# Patient Record
Sex: Female | Born: 1975 | Race: White | Hispanic: No | Marital: Single | Smoking: Former smoker
Health system: Southern US, Community
[De-identification: ages and names within clinical notes are randomized; demographics above are authoritative.]

## PROBLEM LIST (undated history)

## (undated) DIAGNOSIS — E119 Type 2 diabetes mellitus without complications: Secondary | ICD-10-CM

## (undated) DIAGNOSIS — B019 Varicella without complication: Secondary | ICD-10-CM

## (undated) HISTORY — PX: GALLBLADDER SURGERY: SHX652

## (undated) HISTORY — DX: Type 2 diabetes mellitus without complications: E11.9

## (undated) HISTORY — DX: Varicella without complication: B01.9

---

## 2003-03-08 HISTORY — PX: KNEE ARTHROSCOPY: SHX127

## 2005-07-18 ENCOUNTER — Ambulatory Visit: Payer: Self-pay | Admitting: Specialist

## 2005-08-05 ENCOUNTER — Ambulatory Visit: Payer: Self-pay | Admitting: Specialist

## 2015-08-05 ENCOUNTER — Other Ambulatory Visit: Payer: Self-pay | Admitting: Obstetrics and Gynecology

## 2015-08-05 DIAGNOSIS — Z1231 Encounter for screening mammogram for malignant neoplasm of breast: Secondary | ICD-10-CM

## 2015-08-14 ENCOUNTER — Other Ambulatory Visit: Payer: Self-pay | Admitting: Obstetrics and Gynecology

## 2015-08-14 ENCOUNTER — Ambulatory Visit
Admission: RE | Admit: 2015-08-14 | Discharge: 2015-08-14 | Disposition: A | Discharge status: Home / Self Care | Payer: BC Managed Care – PPO | Source: Ambulatory Visit | Attending: Obstetrics and Gynecology | Admitting: Obstetrics and Gynecology

## 2015-08-14 DIAGNOSIS — Z1231 Encounter for screening mammogram for malignant neoplasm of breast: Secondary | ICD-10-CM

## 2017-06-08 ENCOUNTER — Encounter: Payer: Self-pay | Admitting: Emergency Medicine

## 2017-06-08 ENCOUNTER — Other Ambulatory Visit: Payer: Self-pay

## 2017-06-08 ENCOUNTER — Emergency Department
Admission: EM | Admit: 2017-06-08 | Discharge: 2017-06-08 | Disposition: A | Discharge status: Home / Self Care | Payer: BC Managed Care – PPO | Attending: Emergency Medicine | Admitting: Emergency Medicine

## 2017-06-08 ENCOUNTER — Emergency Department: Payer: BC Managed Care – PPO

## 2017-06-08 DIAGNOSIS — S82839A Other fracture of upper and lower end of unspecified fibula, initial encounter for closed fracture: Secondary | ICD-10-CM

## 2017-06-08 DIAGNOSIS — X501XXA Overexertion from prolonged static or awkward postures, initial encounter: Secondary | ICD-10-CM | POA: Insufficient documentation

## 2017-06-08 DIAGNOSIS — F17210 Nicotine dependence, cigarettes, uncomplicated: Secondary | ICD-10-CM | POA: Diagnosis not present

## 2017-06-08 DIAGNOSIS — Y939 Activity, unspecified: Secondary | ICD-10-CM | POA: Diagnosis not present

## 2017-06-08 DIAGNOSIS — Y929 Unspecified place or not applicable: Secondary | ICD-10-CM | POA: Diagnosis not present

## 2017-06-08 DIAGNOSIS — S82892A Other fracture of left lower leg, initial encounter for closed fracture: Secondary | ICD-10-CM | POA: Diagnosis not present

## 2017-06-08 DIAGNOSIS — Y999 Unspecified external cause status: Secondary | ICD-10-CM | POA: Diagnosis not present

## 2017-06-08 MED ORDER — MELOXICAM 15 MG PO TABS: 15.0000 mg | ORAL_TABLET | Freq: Every day | ORAL | 0 refills | Status: DC

## 2017-06-08 MED ORDER — HYDROCODONE-ACETAMINOPHEN 5-325 MG PO TABS: 1.0000 | ORAL_TABLET | ORAL | 0 refills | Status: AC | PRN

## 2017-06-08 MED ORDER — HYDROCODONE-ACETAMINOPHEN 5-325 MG PO TABS
1.0000 | ORAL_TABLET | Freq: Once | ORAL | Status: AC
  Administered 2017-06-08: 1 via ORAL
  Filled 2017-06-08: qty 1

## 2017-06-08 NOTE — ED Provider Notes (Signed)
[facility] Emergency Department Provider Note ____________________________________________  Time seen: Approximately 6:51 PM  I have reviewed the triage vital signs and the nursing notes.   HISTORY  Chief Complaint Ankle Pain    HPI Colleen Wagner is a 42 y.o. female who presents to the emergency department for evaluation and treatment of left ankle pain after a twist injury prior to arrival. She did not fall. She denies other injury or pain. No alleviating measures have been attempted for this complaint.  History reviewed. No pertinent past medical history.  There are no active problems to display for this patient.   History reviewed. No pertinent surgical history.  Prior to Admission medications   Medication Sig Start Date End Date Taking? Authorizing Provider  HYDROcodone-acetaminophen (NORCO/VICODIN) 5-325 MG tablet Take 1 tablet by mouth every 4 (four) hours as needed for moderate pain. 06/08/17 06/08/18  Jose Corvin, Rulon Eisenmengerari B, FNP  meloxicam (MOBIC) 15 MG tablet Take 1 tablet (15 mg total) by mouth daily. 06/08/17   Chinita Pesterriplett, Cleven Jansma B, FNP    Allergies Patient has no known allergies.  History reviewed. No pertinent family history.  Social History Social History   Tobacco Use  . Smoking status: Current Every Day Smoker    Packs/day: 0.50  . Smokeless tobacco: Never Used  Substance Use Topics  . Alcohol use: Not on file    Comment: rarely  . Drug use: Never    Review of Systems Constitutional: Negative for fever. Cardiovascular: Negative for chest pain. Respiratory: Negative for shortness of breath. Musculoskeletal: Positive for left ankle pain. Skin: Negative for open wound or lesion.  Neurological: Negative for decrease in sensation  ____________________________________________   PHYSICAL EXAM:  VITAL SIGNS: ED Triage Vitals  Enc Vitals Group     BP 06/08/17 1758 (!) 142/96     Pulse Rate 06/08/17 1758 95     Resp 06/08/17 1758 18   Temp 06/08/17 1758 98.9 F (37.2 C)     Temp Source 06/08/17 1758 Oral     SpO2 06/08/17 1758 97 %     Weight 06/08/17 1758 250 lb (113.4 kg)     Height 06/08/17 1758 5\' 5"  (1.651 m)     Head Circumference --      Peak Flow --      Pain Score 06/08/17 1759 6     Pain Loc --      Pain Edu? --      Excl. in GC? --     Constitutional: Alert and oriented. Well appearing and in no acute distress. Eyes: Conjunctivae are clear without discharge or drainage Head: Atraumatic Neck: Supple Respiratory: No cough. Respirations are even and unlabored. Musculoskeletal: ATFL pattern edema of the left ankle.  Neurologic: Motor and sensory function intact.  Skin: Intact, specifically over the left foot and ankle.  Psychiatric: Affect and behavior are appropriate.  ____________________________________________   LABS (all labs ordered are listed, but only abnormal results are displayed)  Labs Reviewed - No data to display ____________________________________________  RADIOLOGY  Images of the left ankle shows a possible avulsion fracture off the lateral fibular malleolus per radiology.  I, Kem Boroughsari [place], personally viewed and evaluated these images (plain radiographs) as part of my medical decision making, as well as reviewing the written report by the radiologist.  ___________________________________________   PROCEDURES  .Splint Application Date/Time: 06/08/2017 8:24 PM Performed by: May, Lisa J, NT Authorized by: Chinita Pesterriplett, Aniqua Briere B, FNP   Consent:    Consent obtained:  Verbal  Consent given by:  Patient   Risks discussed:  Pain and swelling Pre-procedure details:    Sensation:  Normal Procedure details:    Laterality:  Left   Location:  Ankle   Ankle:  L ankle   Splint type:  Ankle stirrup   Supplies:  Cotton padding, Ortho-Glass and elastic bandage Post-procedure details:    Pain:  Unchanged   Sensation:  Normal   Patient tolerance of procedure:  Tolerated well, no  immediate complications  ___________________________________________   INITIAL IMPRESSION / ASSESSMENT AND PLAN / ED COURSE  Colleen Wagner is a 42 y.o. who presents to the emergency department for treatment and evaluation of left ankle pain after a inversion injury prior to arrival.  Image and exam are consistent with a lateral malleolar fracture.  She was placed in a OCL stirrup and encouraged to avoid weightbearing as much as possible.  She will be given crutches, but was advised to use 1 crutch under the left arm to assist with ambulation  Patient instructed to follow-up with podiatry.  She was also instructed to return to the emergency department for symptoms that change or worsen if unable schedule an appointment with orthopedics or primary care.  Medications  HYDROcodone-acetaminophen (NORCO/VICODIN) 5-325 MG per tablet 1 tablet (has no administration in time range)    Pertinent labs & imaging results that were available during my care of the patient were reviewed by me and considered in my medical decision making (see chart for details).  _________________________________________   FINAL CLINICAL IMPRESSION(S) / ED DIAGNOSES  Final diagnoses:  Avulsion fracture of distal end of fibula    ED Discharge Orders        Ordered    HYDROcodone-acetaminophen (NORCO/VICODIN) 5-325 MG tablet  Every 4 hours PRN     06/08/17 2018    meloxicam (MOBIC) 15 MG tablet  Daily     06/08/17 2018       If controlled substance prescribed during this visit, 12 month history viewed on the [facility] prior to issuing an initial prescription for Schedule II or III opiod.    Chinita Pester, FNP 06/08/17 2026    Minna Antis, MD 06/09/17 0005

## 2017-06-08 NOTE — ED Triage Notes (Signed)
Pt took a wrong step and fell; turning her ankle. She denies hitting her head or loc. Pt states she almost vomited from the pain when she tried to bear weight on the ankle. Pt alert & oriented with NAD noted.

## 2017-06-08 NOTE — Discharge Instructions (Addendum)
Please call tomorrow to schedule an appointment with podiatry. Return to the ER for symptoms of concern if unable to see your PCP or the specialist.

## 2018-02-14 ENCOUNTER — Emergency Department
Admission: EM | Admit: 2018-02-14 | Discharge: 2018-02-14 | Disposition: A | Discharge status: Home / Self Care | Payer: BC Managed Care – PPO | Attending: Emergency Medicine | Admitting: Emergency Medicine

## 2018-02-14 ENCOUNTER — Other Ambulatory Visit: Payer: Self-pay

## 2018-02-14 DIAGNOSIS — W109XXA Fall (on) (from) unspecified stairs and steps, initial encounter: Secondary | ICD-10-CM | POA: Diagnosis not present

## 2018-02-14 DIAGNOSIS — F17200 Nicotine dependence, unspecified, uncomplicated: Secondary | ICD-10-CM | POA: Diagnosis not present

## 2018-02-14 DIAGNOSIS — Y999 Unspecified external cause status: Secondary | ICD-10-CM | POA: Diagnosis not present

## 2018-02-14 DIAGNOSIS — Z79899 Other long term (current) drug therapy: Secondary | ICD-10-CM | POA: Insufficient documentation

## 2018-02-14 DIAGNOSIS — Y939 Activity, unspecified: Secondary | ICD-10-CM | POA: Insufficient documentation

## 2018-02-14 DIAGNOSIS — Y929 Unspecified place or not applicable: Secondary | ICD-10-CM | POA: Insufficient documentation

## 2018-02-14 DIAGNOSIS — S39012A Strain of muscle, fascia and tendon of lower back, initial encounter: Secondary | ICD-10-CM | POA: Diagnosis not present

## 2018-02-14 DIAGNOSIS — S3982XA Other specified injuries of lower back, initial encounter: Secondary | ICD-10-CM | POA: Diagnosis present

## 2018-02-14 MED ORDER — KETOROLAC TROMETHAMINE 30 MG/ML IJ SOLN
30.0000 mg | Freq: Once | INTRAMUSCULAR | Status: AC
  Administered 2018-02-14: 30 mg via INTRAMUSCULAR
  Filled 2018-02-14: qty 1

## 2018-02-14 MED ORDER — KETOROLAC TROMETHAMINE 10 MG PO TABS: 10.0000 mg | ORAL_TABLET | Freq: Four times a day (QID) | ORAL | 0 refills | Status: DC | PRN

## 2018-02-14 MED ORDER — CYCLOBENZAPRINE HCL 10 MG PO TABS: 10.0000 mg | ORAL_TABLET | Freq: Three times a day (TID) | ORAL | 0 refills | Status: DC | PRN

## 2018-02-14 NOTE — ED Triage Notes (Signed)
Pt in with co lower back pain states started after missing a step and states "when I caught myself I felt pain".

## 2018-02-14 NOTE — Discharge Instructions (Signed)
Follow up with the primary care provider of your choice for symptoms that are not improving over the next few days.  Return to the ER for symptoms that change or worsen if unable to schedule an appointment. 

## 2018-02-14 NOTE — ED Provider Notes (Signed)
[facility] Emergency Department Provider Note ____________________________________________  Time seen: Approximately 9:25 PM  I have reviewed the triage vital signs and the nursing notes.  HISTORY  Chief Complaint Back Pain   HPI Lyman Bishopara J Wood is a 42 y.o. female who presents to the emergency department after missing a step which caused her to slip and fall forward. She was able to catch herself before striking the ground. She felt a pull on the right side of her back. She has had a similar injury in the past that was eventually treated with toradol which provided her great relief. No other symptoms or injury of concern.  No past medical history on file.  There are no active problems to display for this patient.   No past surgical history on file.  Prior to Admission medications   Medication Sig Start Date End Date Taking? Authorizing Provider  cyclobenzaprine (FLEXERIL) 10 MG tablet Take 1 tablet (10 mg total) by mouth 3 (three) times daily as needed for muscle spasms. 02/14/18   Kelii Chittum, Rulon Eisenmengerari B, FNP  HYDROcodone-acetaminophen (NORCO/VICODIN) 5-325 MG tablet Take 1 tablet by mouth every 4 (four) hours as needed for moderate pain. 06/08/17 06/08/18  Fabion Gatson, Rulon Eisenmengerari B, FNP  ketorolac (TORADOL) 10 MG tablet Take 1 tablet (10 mg total) by mouth every 6 (six) hours as needed. 02/14/18   Alexzander Dolinger, Rulon Eisenmengerari B, FNP  meloxicam (MOBIC) 15 MG tablet Take 1 tablet (15 mg total) by mouth daily. 06/08/17   Chinita Pesterriplett, Harles Evetts B, FNP    Allergies Patient has no known allergies.  No family history on file.  Social History Social History   Tobacco Use  . Smoking status: Current Every Day Smoker    Packs/day: 0.50  . Smokeless tobacco: Never Used  Substance Use Topics  . Alcohol use: Not on file    Comment: rarely  . Drug use: Never    Review of Systems Constitutional: Well appearing. Respiratory: Negative for dyspnea. Cardiovascular: Negative for change in skin temperature  or color. Musculoskeletal:   Negative for chronic steroid use   Negative for trauma in the presence of osteoporosis  Negative for age over 6250 and trauma.  Negative for constitutional symptoms, or history of cancer   Negative for pain worse at night. Skin: Negative for rash, lesion, or wound.  Genitourinary: Negative for urinary retention. Rectal: Negative for fecal incontinence or new onset constipation/bowel habit changes. Hematological/Immunilogical: Negative for immunosuppression, IV drug use, or fever Neurological: Negative for burning, tingling, numb, electric, radiating pain in the lower extremities.                        Negative for saddle anesthesia.                        Negative for focal neurologic deficit, progressive or disabling symptoms             Negative for saddle anesthesia. ____________________________________________   PHYSICAL EXAM:  VITAL SIGNS: ED Triage Vitals  Enc Vitals Group     BP 02/14/18 1942 (!) 156/100     Pulse Rate 02/14/18 1942 90     Resp 02/14/18 1942 16     Temp 02/14/18 1942 98.6 F (37 C)     Temp Source 02/14/18 1942 Oral     SpO2 02/14/18 1942 97 %     Weight 02/14/18 1944 240 lb (108.9 kg)     Height 02/14/18 1944 5\' 4"  (  1.626 m)     Head Circumference --      Peak Flow --      Pain Score 02/14/18 1944 6     Pain Loc --      Pain Edu? --      Excl. in GC? --     Constitutional: Alert and oriented. Well appearing and in no acute distress. Eyes: Conjunctivae are clear without discharge or drainage.  Head: Atraumatic. Neck: Full, active range of motion. Respiratory: Respirations even and unlabored. Musculoskeletal: Guarded flexion and rotation of the lumbar secondary to pain.  FROM of the extremities, Strength 5/5 of the lower extremities as tested. Neurologic: Reflexes of the lower extremities are 2+. Negative straight leg raise on the right and left side. Skin: Atraumatic.  Psychiatric: Behavior and affect are  normal.  ____________________________________________   LABS (all labs ordered are listed, but only abnormal results are displayed)  Labs Reviewed - No data to display ____________________________________________  RADIOLOGY  Not indicated. ____________________________________________   PROCEDURES  Procedure(s) performed:  Procedures ____________________________________________   INITIAL IMPRESSION / ASSESSMENT AND PLAN / ED COURSE  CALEE NUGENT is a 42 y.o. female who presents to the emergency department for treatment and evaluation of right side lumbosacral strain. She received an injection of Toradol with some relief. She will receive a prescription for the same as well as flexeril. She was advised to follow up with the PCP of her choice or return to the [facility] for symptoms that change or worsen if unable to schedule an appointment.  Medications  ketorolac (TORADOL) 30 MG/ML injection 30 mg (30 mg Intramuscular Given 02/14/18 2051)    ED Discharge Orders         Ordered    ketorolac (TORADOL) 10 MG tablet  Every 6 hours PRN    Note to Pharmacy:  Patient had injection in the ED   02/14/18 2117    cyclobenzaprine (FLEXERIL) 10 MG tablet  3 times daily PRN     02/14/18 2117           Pertinent labs & imaging results that were available during my care of the patient were reviewed by me and considered in my medical decision making (see chart for details).  _________________________________________   FINAL CLINICAL IMPRESSION(S) / ED DIAGNOSES  Final diagnoses:  Lumbosacral strain, initial encounter     If controlled substance prescribed during this visit, 12 month history viewed on the [facility] prior to issuing an initial prescription for Schedule II or III opiod.    Chinita Pester, FNP 02/14/18 2144    Minna Antis, MD 02/14/18 2303

## 2019-05-04 ENCOUNTER — Ambulatory Visit: Payer: BC Managed Care – PPO

## 2019-08-14 ENCOUNTER — Other Ambulatory Visit: Payer: Self-pay | Admitting: Certified Nurse Midwife

## 2019-08-14 DIAGNOSIS — Z1231 Encounter for screening mammogram for malignant neoplasm of breast: Secondary | ICD-10-CM

## 2019-08-14 DIAGNOSIS — E1165 Type 2 diabetes mellitus with hyperglycemia: Secondary | ICD-10-CM | POA: Insufficient documentation

## 2019-08-14 DIAGNOSIS — B977 Papillomavirus as the cause of diseases classified elsewhere: Secondary | ICD-10-CM | POA: Insufficient documentation

## 2019-08-14 DIAGNOSIS — E119 Type 2 diabetes mellitus without complications: Secondary | ICD-10-CM | POA: Insufficient documentation

## 2019-08-15 ENCOUNTER — Other Ambulatory Visit: Payer: Self-pay

## 2019-08-15 ENCOUNTER — Ambulatory Visit
Admission: RE | Admit: 2019-08-15 | Discharge: 2019-08-15 | Disposition: A | Discharge status: Home / Self Care | Payer: BC Managed Care – PPO | Source: Ambulatory Visit | Attending: Certified Nurse Midwife | Admitting: Certified Nurse Midwife

## 2019-08-15 DIAGNOSIS — Z1231 Encounter for screening mammogram for malignant neoplasm of breast: Secondary | ICD-10-CM | POA: Diagnosis present

## 2019-11-08 ENCOUNTER — Ambulatory Visit (INDEPENDENT_AMBULATORY_CARE_PROVIDER_SITE_OTHER): Payer: BC Managed Care – PPO

## 2019-11-08 ENCOUNTER — Encounter: Payer: Self-pay | Admitting: Nurse Practitioner

## 2019-11-08 ENCOUNTER — Other Ambulatory Visit: Payer: Self-pay

## 2019-11-08 ENCOUNTER — Ambulatory Visit (INDEPENDENT_AMBULATORY_CARE_PROVIDER_SITE_OTHER): Payer: BC Managed Care – PPO | Admitting: Nurse Practitioner

## 2019-11-08 VITALS — BP 120/86 | HR 95 | Temp 98.5°F | Ht 65.0 in | Wt 253.0 lb

## 2019-11-08 DIAGNOSIS — M21962 Unspecified acquired deformity of left lower leg: Secondary | ICD-10-CM | POA: Insufficient documentation

## 2019-11-08 DIAGNOSIS — G8929 Other chronic pain: Secondary | ICD-10-CM | POA: Insufficient documentation

## 2019-11-08 DIAGNOSIS — E785 Hyperlipidemia, unspecified: Secondary | ICD-10-CM | POA: Diagnosis not present

## 2019-11-08 DIAGNOSIS — E119 Type 2 diabetes mellitus without complications: Secondary | ICD-10-CM | POA: Diagnosis not present

## 2019-11-08 DIAGNOSIS — Z114 Encounter for screening for human immunodeficiency virus [HIV]: Secondary | ICD-10-CM

## 2019-11-08 DIAGNOSIS — Z1159 Encounter for screening for other viral diseases: Secondary | ICD-10-CM

## 2019-11-08 DIAGNOSIS — M25511 Pain in right shoulder: Secondary | ICD-10-CM | POA: Diagnosis not present

## 2019-11-08 DIAGNOSIS — N921 Excessive and frequent menstruation with irregular cycle: Secondary | ICD-10-CM | POA: Insufficient documentation

## 2019-11-08 DIAGNOSIS — Z720 Tobacco use: Secondary | ICD-10-CM | POA: Insufficient documentation

## 2019-11-08 DIAGNOSIS — Z0000 Encounter for general adult medical examination without abnormal findings: Secondary | ICD-10-CM

## 2019-11-08 NOTE — Patient Instructions (Addendum)
Fasting lab next week.   Xrays today of the shoulder. Tylenol Arthritis for pain.   Please obtain a Pfizer or Moderna Covid vaccine ASAP.   Please arrange OV in 1 week for nurse meeting to learn glucometer use and beginning diabetic instruction.  We will ask her to check her fasting blood sugar once a day and record. Will dsicuss beginning Metformin at next office visit.  I placed a referral in to the [facility] Lifestyle Diabetes Educator- a 3 hour program to get you educated about DM and diet management and blood sugar checks. If your insurance does not cover this program, let me know and we will make other arrangements.  Begin to treat your diabetes by diet management. Cut back on any sweet drinks, double portions, and carbs. See below:   Diet Recommendations for Diabetes  Carbohydrate includes starch, sugar, and fiber.  Of these, only sugar and starch raise blood glucose.  (Fiber is found in fruits, vegetables [especially skin, seeds, and stalks] and whole grains.)   Starchy (carb) foods: Bread, rice, pasta, potatoes, corn, cereal, grits, crackers, bagels, muffins, all baked goods.  (Fruit, milk, and yogurt also have carbohydrate, but most of these foods will not spike your blood sugar as most starchy foods will.)  A few fruits do cause high blood sugars; use small portions of bananas (limit to 1/2 at a time), grapes, watermelon, oranges, and most tropical fruits.   Protein foods: Meat, fish, poultry, eggs, dairy foods, and beans such as pinto and kidney beans (beans also provide carbohydrate).   1. Eat at least REAL 3 meals and 1-2 snacks per day. Never go more than 4-5 hours while awake without eating. Eat breakfast within the first hour of getting up.   2. Limit starchy foods to TWO per meal and ONE per snack. ONE portion of a starchy  food is equal to the following:   - ONE slice of bread (or its equivalent, such as half of a hamburger bun).   - 1/2 cup of a "scoopable" starchy food such as  potatoes or rice.   - 15 grams of Total Carbohydrate as shown on food label.   - Every 4 ounces of a sweet drink (including fruit juice) 3. Include at every meal: a protein food, a carb food, and vegetables and/or fruit.   - Obtain twice the volume of veg's as protein or carbohydrate foods for both lunch and dinner.   - Fresh or frozen veg's are best.   - Keep frozen veg's on hand for a quick vegetable serving.        Diabetes Mellitus and Standards of Medical Care Managing diabetes (diabetes mellitus) can be complicated. Your diabetes treatment may be managed by a team of health care providers, including:  A physician who specializes in diabetes (endocrinologist).  A nurse practitioner or physician assistant.  Nurses.  A diet and nutrition specialist (registered dietitian).  A certified diabetes educator (CDE).  An exercise specialist.  A pharmacist.  An eye doctor.  A foot specialist (podiatrist).  A dentist.  A primary care provider.  A mental health provider. Your health care providers follow guidelines to help you get the best quality of care. The following schedule is a general guideline for your diabetes management plan. Your health care providers may give you more specific instructions. Physical exams Upon being diagnosed with diabetes mellitus, and each year after that, your health care provider will ask about your medical and family history. He or she will  also do a physical exam. Your exam may include:  Measuring your height, weight, and body mass index (BMI).  Checking your blood pressure. This will be done at every routine medical visit. Your target blood pressure may vary depending on your medical conditions, your age, and other factors.  Thyroid gland exam.  Skin exam.  Screening for damage to your nerves (peripheral neuropathy). This may include checking the pulse in your legs and feet and checking the level of sensation in your hands and feet.  A  complete foot exam to inspect the structure and skin of your feet, including checking for cuts, bruises, redness, blisters, sores, or other problems.  Screening for blood vessel (vascular) problems, which may include checking the pulse in your legs and feet and checking your temperature. Blood tests Depending on your treatment plan and your personal needs, you may have the following tests done:  HbA1c (hemoglobin A1c). This test provides information about blood sugar (glucose) control over the previous 2-3 months. It is used to adjust your treatment plan, if needed. This test will be done: ? At least 2 times a year, if you are meeting your treatment goals. ? 4 times a year, if you are not meeting your treatment goals or if treatment goals have changed.  Lipid testing, including total, LDL, and HDL cholesterol and triglyceride levels. ? The goal for LDL is less than 100 mg/dL (5.5 mmol/L). If you are at high risk for complications, the goal is less than 70 mg/dL (3.9 mmol/L). ? The goal for HDL is 40 mg/dL (2.2 mmol/L) or higher for men and 50 mg/dL (2.8 mmol/L) or higher for women. An HDL cholesterol of 60 mg/dL (3.3 mmol/L) or higher gives some protection against heart disease. ? The goal for triglycerides is less than 150 mg/dL (8.3 mmol/L).  Liver function tests.  Kidney function tests.  Thyroid function tests. Dental and eye exams  Visit your dentist two times a year.  If you have type 1 diabetes, your health care provider may recommend an eye exam 3-5 years after you are diagnosed, and then once a year after your first exam. ? For children with type 1 diabetes, a health care provider may recommend an eye exam when your child is age 45 or older and has had diabetes for 3-5 years. After the first exam, your child should get an eye exam once a year.  If you have type 2 diabetes, your health care provider may recommend an eye exam as soon as you are diagnosed, and then once a year after  your first exam. Immunizations   The yearly flu (influenza) vaccine is recommended for everyone 6 months or older who has diabetes.  The pneumonia (pneumococcal) vaccine is recommended for everyone 2 years or older who has diabetes. If you are 2265 or older, you may get the pneumonia vaccine as a series of two separate shots.  The hepatitis B vaccine is recommended for adults shortly after being diagnosed with diabetes.  Adults and children with diabetes should receive all other vaccines according to age-specific recommendations from the Centers for Disease Control and Prevention (CDC). Mental and emotional health Screening for symptoms of eating disorders, anxiety, and depression is recommended at the time of diagnosis and afterward as needed. If your screening shows that you have symptoms (positive screening result), you may need more evaluation and you may work with a mental health care provider. Treatment plan Your treatment plan will be reviewed at every medical visit. You and  your health care provider will discuss:  How you are taking your medicines, including insulin.  Any side effects you are experiencing.  Your blood glucose target goals.  The frequency of your blood glucose monitoring.  Lifestyle habits, such as activity level as well as tobacco, alcohol, and substance use. Diabetes self-management education Your health care provider will assess how well you are monitoring your blood glucose levels and whether you are taking your insulin correctly. He or she may refer you to:  A certified diabetes educator to manage your diabetes throughout your life, starting at diagnosis.  A registered dietitian who can create or review your personal nutrition plan.  An exercise specialist who can discuss your activity level and exercise plan. Summary  Managing diabetes (diabetes mellitus) can be complicated. Your diabetes treatment may be managed by a team of health care  providers.  Your health care providers follow guidelines in order to help you get the best quality of care.  Standards of care including having regular physical exams, blood tests, blood pressure monitoring, immunizations, screening tests, and education about how to manage your diabetes.  Your health care providers may also give you more specific instructions based on your individual health. This information is not intended to replace advice given to you by your health care provider. Make sure you discuss any questions you have with your health care provider. Document Revised: 11/10/2017 Document Reviewed: 11/20/2015 Elsevier Patient Education  2020 [employer].  Steps to Quit Smoking Smoking tobacco is the leading cause of preventable death. It can affect almost every organ in the body. Smoking puts you and those around you at risk for developing many serious chronic diseases. Quitting smoking can be difficult, but it is one of the best things that you can do for your health. It is never too late to quit. How do I get ready to quit? When you decide to quit smoking, create a plan to help you succeed. Before you quit:  Pick a date to quit. Set a date within the next 2 weeks to give you time to prepare.  Write down the reasons why you are quitting. Keep this list in places where you will see it often.  Tell your family, friends, and co-workers that you are quitting. Support from your loved ones can make quitting easier.  Talk with your health care provider about your options for quitting smoking.  Find out what treatment options are covered by your health insurance.  Identify people, places, things, and activities that make you want to smoke (triggers). Avoid them. What first steps can I take to quit smoking?  Throw away all cigarettes at home, at work, and in your car.  Throw away smoking accessories, such as Set designer.  Clean your car. Make sure to empty the  ashtray.  Clean your home, including curtains and carpets. What strategies can I use to quit smoking? Talk with your health care provider about combining strategies, such as taking medicines while you are also receiving in-person counseling. Using these two strategies together makes you more likely to succeed in quitting than if you used either strategy on its own.  If you are pregnant or breastfeeding, talk with your health care provider about finding counseling or other support strategies to quit smoking. Do not take medicine to help you quit smoking unless your health care provider tells you to do so. To quit smoking: Quit right away  Quit smoking completely, instead of gradually reducing how much you smoke  over a period of time. Research shows that stopping smoking right away is more successful than gradually quitting.  Attend in-person counseling to help you build problem-solving skills. You are more likely to succeed in quitting if you attend counseling sessions regularly. Even short sessions of 10 minutes can be effective. Take medicine You may take medicines to help you quit smoking. Some medicines require a prescription and some you can purchase over-the-counter. Medicines may have nicotine in them to replace the nicotine in cigarettes. Medicines may:  Help to stop cravings.  Help to relieve withdrawal symptoms. Your health care provider may recommend:  Nicotine patches, gum, or lozenges.  Nicotine inhalers or sprays.  Non-nicotine medicine that is taken by mouth. Find resources Find resources and support systems that can help you to quit smoking and remain smoke-free after you quit. These resources are most helpful when you use them often. They include:  Online chats with a Veterinary surgeon.  Telephone quitlines.  Printed Materials engineer.  Support groups or group counseling.  Text messaging programs.  Mobile phone apps or applications. Use apps that can help you stick to  your quit plan by providing reminders, tips, and encouragement. There are many free apps for mobile devices as well as websites. Examples include Quit Guide from the [employer] and smokefree.gov What things can I do to make it easier to quit?   Reach out to your family and friends for support and encouragement. Call telephone quitlines (1-800-QUIT-NOW), reach out to support groups, or work with a counselor for support.  Ask people who smoke to avoid smoking around you.  Avoid places that trigger you to smoke, such as bars, parties, or smoke-break areas at work.  Spend time with people who do not smoke.  Lessen the stress in your life. Stress can be a smoking trigger for some people. To lessen stress, try: ? Exercising regularly. ? Doing deep-breathing exercises. ? Doing yoga. ? Meditating. ? Performing a body scan. This involves closing your eyes, scanning your body from head to toe, and noticing which parts of your body are particularly tense. Try to relax the muscles in those areas. How will I feel when I quit smoking? Day 1 to 3 weeks Within the first 24 hours of quitting smoking, you may start to feel withdrawal symptoms. These symptoms are usually most noticeable 2-3 days after quitting, but they usually do not last for more than 2-3 weeks. You may experience these symptoms:  Mood swings.  Restlessness, anxiety, or irritability.  Trouble concentrating.  Dizziness.  Strong cravings for sugary foods and nicotine.  Mild weight gain.  Constipation.  Nausea.  Coughing or a sore throat.  Changes in how the medicines that you take for unrelated issues work in your body.  Depression.  Trouble sleeping (insomnia). Week 3 and afterward After the first 2-3 weeks of quitting, you may start to notice more positive results, such as:  Improved sense of smell and taste.  Decreased coughing and sore throat.  Slower heart rate.  Lower blood pressure.  Clearer skin.  The ability  to breathe more easily.  Fewer sick days. Quitting smoking can be very challenging. Do not get discouraged if you are not successful the first time. Some people need to make many attempts to quit before they achieve long-term success. Do your best to stick to your quit plan, and talk with your health care provider if you have any questions or concerns. Summary  Smoking tobacco is the leading cause of preventable death.  Quitting smoking is one of the best things that you can do for your health.  When you decide to quit smoking, create a plan to help you succeed.  Quit smoking right away, not slowly over a period of time.  When you start quitting, seek help from your health care provider, family, or friends. This information is not intended to replace advice given to you by your health care provider. Make sure you discuss any questions you have with your health care provider. Document Revised: 11/16/2018 Document Reviewed: 05/12/2018 Elsevier Patient Education  2020 [employer].

## 2019-11-08 NOTE — Progress Notes (Signed)
Established Patient Office Visit  Subjective:  Patient ID: Colleen Wagner, female    DOB: Oct 22, 1975  Age: 44 y.o. MRN: [id number]  CC:  Chief Complaint  Patient presents with  . New Patient (Initial Visit)    establish care    HPI Colleen Wagner is a 44 year old patient who would like to establish care with primary care.  She has been followed through her gynecology office and was recently told she has new onset diabetes mellitus because her A1c returned 7.3.  She also has problems with high cholesterol. Her main concern today is right shoulder pain of 6 months or longer duration and she is losing function of the shoulder.  Type 2 diabetes mellitus: Chart review shows a A1c of 6.3 in 2017.  Recent A1c is 7.4 does give her new diagnosis of diabetes mellitus.  We discussed this today.  Patient has had no diabetes education, does not have glucometer or know how to do a fingerstick.  We will arrange this with nursing staff here.  She has been on no medication.  We did discuss dietary changes and management today.   BMI 42/morbid obesity/HLD: Lipid panel shows cholesterol of 210, triglycerides 230, HDL 38.8, LDL 125.  Eating a regular diet.  Right shoulder pain: Onset at least 6 months or more, she cannot really say. She has had no specific injury or trauma.  She has played softball all her life.  Last year, she recalls trying to pitch to her son in the yard and could not pitch overhand.  It hurt her shoulder.  She was able to pitch underhand.  Since then, she cannot reach back, or lift her arm up, shave under her arm, or brush her hair, and it aches when she sleeps.  Currently, just sitting in the chair, she feels like there is pain from the shoulder that radiates to the elbow.  She will get occasional right hand numbness and tingling.  No neck pain or injury.  She has noted no weakness in the arm strength or hand strength.     She has also noted a bony knot that came up on her left lateral foot a week  ago.  No injury or trauma.  No tenderness or discomfort in this area.  She ambulates without difficulty.   FH : father DM, paternal gm DM  Patient presents today for complete physical.  Immunizations: Declines Covid vaccine.  Declines flu vaccine.  No record of vaccines in the system.  Unsure of when her last tetanus was. Diet: regular Exercise: no Colonoscopy:NA- next year Pap Smear: Done 08/14/2019: Epithelial cell abnormality, ASCUS, HPV positive followed by GYN Mammogram: Done per GYN. Birth control: IUD Tobacco: Smokes cigarettes 1/2 pack/day Vision: Dentist:   Past Medical History:  Diagnosis Date  . Chicken pox   . Diabetes mellitus without complication [facility])     Past Surgical History:  Procedure Laterality Date  . GALLBLADDER SURGERY      Family History  Problem Relation Age of Onset  . Hyperlipidemia Mother   . Hypertension Father   . Diabetes Father   . Stroke Father   . Miscarriages / Stillbirths Sister   . Arthritis Paternal Grandmother   . Diabetes Paternal Grandmother   . Breast cancer Neg Hx     Social History   Socioeconomic History  . Marital status: Divorced    Spouse name: Not on file  . Number of children: Not on file  . Years of education: Not  on file  . Highest education level: Bachelor's degree (e.g., BA, AB, BS)  Occupational History  . Not on file  Tobacco Use  . Smoking status: Current Every Day Smoker    Packs/day: 0.50    Years: 25.00    Pack years: 12.50    Types: Cigarettes  . Smokeless tobacco: Never Used  . Tobacco comment: Enjoys smoking  Vaping Use  . Vaping Use: Former  Substance and Sexual Activity  . Alcohol use: Yes    Comment: rarely  . Drug use: Never  . Sexual activity: Yes    Birth control/protection: I.U.D.  Other Topics Concern  . Not on file  Social History Narrative   No married with 2 sons and works [employer] Conservation officer, historic buildings   Social Determinants of Radio broadcast assistant Strain:   . Difficulty of  Paying Living Expenses: Not on file  Food Insecurity:   . Worried About Charity fundraiser in the Last Year: Not on file  . Ran Out of Food in the Last Year: Not on file  Transportation Needs:   . Lack of Transportation (Medical): Not on file  . Lack of Transportation (Non-Medical): Not on file  Physical Activity:   . Days of Exercise per Week: Not on file  . Minutes of Exercise per Session: Not on file  Stress:   . Feeling of Stress : Not on file  Social Connections:   . Frequency of Communication with Friends and Family: Not on file  . Frequency of Social Gatherings with Friends and Family: Not on file  . Attends Religious Services: Not on file  . Active Member of Clubs or Organizations: Not on file  . Attends Archivist Meetings: Not on file  . Marital Status: Not on file  Intimate Partner Violence:   . Fear of Current or Ex-Partner: Not on file  . Emotionally Abused: Not on file  . Physically Abused: Not on file  . Sexually Abused: Not on file    Outpatient Medications Prior to Visit  Medication Sig Dispense Refill  . levonorgestrel (MIRENA) 20 MCG/24HR IUD by Intrauterine route.    . cyclobenzaprine (FLEXERIL) 10 MG tablet Take 1 tablet (10 mg total) by mouth 3 (three) times daily as needed for muscle spasms. 30 tablet 0  . ketorolac (TORADOL) 10 MG tablet Take 1 tablet (10 mg total) by mouth every 6 (six) hours as needed. 20 tablet 0  . meloxicam (MOBIC) 15 MG tablet Take 1 tablet (15 mg total) by mouth daily. 30 tablet 0   No facility-administered medications prior to visit.    No Known Allergies  Review of Systems  Constitutional: Negative for chills and fever.  HENT: Negative for congestion and sinus pain.   Eyes: Negative.   Respiratory: Negative for cough and shortness of breath.   Cardiovascular: Negative for chest pain, palpitations and leg swelling.  Gastrointestinal: Negative for abdominal pain, blood in stool, constipation and diarrhea.    Genitourinary: Negative for difficulty urinating, hematuria, menstrual problem, vaginal bleeding and vaginal pain.       IUD- no menses  Musculoskeletal:       Right shoulder- see HPI  Skin: Negative.   Allergic/Immunologic: Negative.   Neurological: Negative.   Hematological: Negative.   Psychiatric/Behavioral:       No concerns with depression/anxiety      Objective:    Physical Exam Vitals reviewed.  Constitutional:      Appearance: She is obese.  HENT:  Head: Normocephalic and atraumatic.  Eyes:     Conjunctiva/sclera: Conjunctivae normal.     Pupils: Pupils are equal, round, and reactive to light.  Cardiovascular:     Rate and Rhythm: Normal rate and regular rhythm.     Pulses: Normal pulses.     Heart sounds: Normal heart sounds.  Pulmonary:     Effort: Pulmonary effort is normal.     Breath sounds: Normal breath sounds.  Abdominal:     Palpations: Abdomen is soft.     Tenderness: There is no abdominal tenderness.  Musculoskeletal:     Cervical back: Normal range of motion and neck supple.     Comments: Right shoulder exam shows no gross deformity, no scars, rashes or lesions.  Mild tenderness AC joint.  No tenderness bicipital groove or trapezius.  Marked decrease in shoulder range of motion forward flexion -cannot raise above chest height.  Active range of motion is painful abduction, cross body, and unable to perform internal or external rotation or extension.  The supraspinatus test slightly weaker on the right than the left and painful with light pressure.  Skin:    General: Skin is warm and dry.  Neurological:     General: No focal deficit present.     Mental Status: She is alert and oriented to person, place, and time.  Psychiatric:        Mood and Affect: Mood normal.        Behavior: Behavior normal.        Thought Content: Thought content normal.        Judgment: Judgment normal.     BP 120/86 (BP Location: Left Arm, Patient Position: Sitting,  Cuff Size: Normal)   Pulse 95   Temp 98.5 F (36.9 C) (Oral)   Ht '5\' 5"'  (1.651 m)   Wt 253 lb (114.8 kg)   SpO2 98%   BMI 42.10 kg/m  Wt Readings from Last 3 Encounters:  11/08/19 253 lb (114.8 kg)  02/14/18 240 lb (108.9 kg)  06/08/17 250 lb (113.4 kg)   Pulse Readings from Last 3 Encounters:  11/08/19 95  02/14/18 90  06/08/17 90    BP Readings from Last 3 Encounters:  11/08/19 120/86  02/14/18 (!) 156/100  06/08/17 (!) 151/90    No results found for: CHOL, HDL, LDLCALC, LDLDIRECT, TRIG, CHOLHDL    Health Maintenance Due  Topic Date Due  . HEMOGLOBIN A1C  Never done  . Hepatitis C Screening  Never done  . PNEUMOCOCCAL POLYSACCHARIDE VACCINE AGE 79-64 HIGH RISK  Never done  . FOOT EXAM  Never done  . OPHTHALMOLOGY EXAM  Never done  . URINE MICROALBUMIN  Never done  . HIV Screening  Never done  . TETANUS/TDAP  Never done    There are no preventive care reminders to display for this patient.  No results found for: TSH No results found for: WBC, HGB, HCT, MCV, PLT No results found for: NA, K, CHLORIDE, CO2, GLUCOSE, BUN, CREATININE, BILITOT, ALKPHOS, AST, ALT, PROT, ALBUMIN, CALCIUM, ANIONGAP, EGFR, GFR No results found for: CHOL No results found for: HDL No results found for: LDLCALC No results found for: TRIG No results found for: CHOLHDL No results found for: HGBA1C    Assessment & Plan:   Problem List Items Addressed This Visit      Endocrine   Type 2 diabetes mellitus without complication, without long-term current use of insulin ([place]) - Primary   Relevant Orders   Comprehensive metabolic panel  Hemoglobin A1c   Microalbumin / creatinine urine ratio   Amb ref to Medical Nutrition Therapy-MNT     Other   Hyperlipidemia   Relevant Orders   Lipid panel   Amb ref to Medical Nutrition Therapy-MNT   Morbid obesity ([place])   Relevant Orders   TSH   VITAMIN D 25 Hydroxy (Vit-D Deficiency, Fractures)   B12 and Folate Panel   Amb ref to Medical  Nutrition Therapy-MNT   Tobacco consumption   Relevant Orders   Amb ref to Medical Nutrition Therapy-MNT   Chronic right shoulder pain   Relevant Orders   DG Shoulder Right (Completed)   Acquired deformity of left foot   Relevant Orders   DG Foot Complete Left (Completed)    Other Visit Diagnoses    Encounter for HCV screening test for low risk patient       Relevant Orders   Hepatitis C antibody   Screening for HIV (human immunodeficiency virus)       Relevant Orders   HIV Antibody (routine testing w rflx)   Encounter for medical examination to establish care       Relevant Orders   CBC with Differential/Platelet      Meds ordered this encounter  Medications  . blood glucose meter kit and supplies    Sig: Dispense based on patient and insurance preference. Use up to four times daily as directed. (FOR ICD-10 E10.9, E11.9).    Dispense:  1 each    Refill:  0    Order Specific Question:   Supervising Provider    Answer:   Einar Pheasant [[id number]]    Order Specific Question:   Number of strips    Answer:   100    Order Specific Question:   Number of lancets    Answer:   100   Lab today-no record of renal function in the system.  Can recommend Tylenol arthritis at this time for shoulder pain.  Await shoulder x-rays.  Will need referral to orthopedics and likely physical therapy.  She has marked decreased active range of motion in the shoulder.   Please arrange OV in 1 week to go over results, learn glucometer use and diabetic instruction.  She will need foot and eye exam.  Obtain covid vaccine first and the will  update other vaccines.   Follow-up: Return in about 1 week (around 11/15/2019).   This visit occurred during the SARS-CoV-2 public health emergency.  Safety protocols were in place, including screening questions prior to the visit, additional usage of staff PPE, and extensive cleaning of exam room while observing appropriate contact time as indicated for disinfecting  solutions.   Denice Paradise, NP

## 2019-11-10 MED ORDER — BLOOD GLUCOSE METER KIT: PACK | 0 refills | Status: AC

## 2019-11-13 ENCOUNTER — Telehealth: Payer: Self-pay | Admitting: Nurse Practitioner

## 2019-11-13 MED ORDER — METFORMIN HCL ER 500 MG PO TB24: ORAL_TABLET | ORAL | 3 refills | Status: DC

## 2019-11-13 NOTE — Telephone Encounter (Signed)
Your a1c is 7.4 (goal is < 7.00).    I would like you to start a medication called metformin XL at the starting dose of 500 mg once daily with evening meal.   The most common side effect is a change in bowel movements it may cause them to be more loose than usual  and it may decrease your appetite. You can begin this medication right away. It will not drop your blood sugars too low.   We will arrange a nurse visit to teach you how to check your blood sugars- please pick up the blood glucose meter that I ordered at your pharmacy and bring it and the supplies with you.   Can you meet with a staff member on 11/15/19 when you come in for labs?

## 2019-11-15 ENCOUNTER — Other Ambulatory Visit: Payer: Self-pay

## 2019-11-15 ENCOUNTER — Other Ambulatory Visit (INDEPENDENT_AMBULATORY_CARE_PROVIDER_SITE_OTHER): Payer: BC Managed Care – PPO

## 2019-11-15 DIAGNOSIS — E785 Hyperlipidemia, unspecified: Secondary | ICD-10-CM

## 2019-11-15 DIAGNOSIS — Z0000 Encounter for general adult medical examination without abnormal findings: Secondary | ICD-10-CM | POA: Diagnosis not present

## 2019-11-15 DIAGNOSIS — E119 Type 2 diabetes mellitus without complications: Secondary | ICD-10-CM

## 2019-11-15 DIAGNOSIS — Z114 Encounter for screening for human immunodeficiency virus [HIV]: Secondary | ICD-10-CM

## 2019-11-15 DIAGNOSIS — Z1159 Encounter for screening for other viral diseases: Secondary | ICD-10-CM

## 2019-11-15 LAB — LIPID PANEL
Cholesterol: 199 mg/dL (ref 0–200)
HDL: 42.5 mg/dL (ref >39.00)
LDL Cholesterol: 120 mg/dL — ABNORMAL HIGH (ref 0–99)
NonHDL: 156.09
Total CHOL/HDL Ratio: 5
Triglycerides: 178 mg/dL — ABNORMAL HIGH (ref 0.0–149.0)
VLDL: 35.6 mg/dL (ref 0.0–40.0)

## 2019-11-15 LAB — CBC WITH DIFFERENTIAL/PLATELET
Basophils Absolute: 0.1 10*3/uL (ref 0.0–0.1)
Basophils Relative: 0.6 % (ref 0.0–3.0)
Eosinophils Absolute: 0.2 10*3/uL (ref 0.0–0.7)
Eosinophils Relative: 2.3 % (ref 0.0–5.0)
HCT: 44.1 % (ref 36.0–46.0)
Hemoglobin: 14.7 g/dL (ref 12.0–15.0)
Lymphocytes Relative: 34.8 % (ref 12.0–46.0)
Lymphs Abs: 3.1 10*3/uL (ref 0.7–4.0)
MCHC: 33.2 g/dL (ref 30.0–36.0)
MCV: 93.7 fl (ref 78.0–100.0)
Monocytes Absolute: 0.6 10*3/uL (ref 0.1–1.0)
Monocytes Relative: 7 % (ref 3.0–12.0)
Neutro Abs: 4.9 10*3/uL (ref 1.4–7.7)
Neutrophils Relative %: 55.3 % (ref 43.0–77.0)
Platelets: 256 10*3/uL (ref 150.0–400.0)
RBC: 4.71 Mil/uL (ref 3.87–5.11)
RDW: 13.6 % (ref 11.5–15.5)
WBC: 9 10*3/uL (ref 4.0–10.5)

## 2019-11-15 LAB — COMPREHENSIVE METABOLIC PANEL
ALT: 10 U/L (ref 0–35)
AST: 14 U/L (ref 0–37)
Albumin: 4.1 g/dL (ref 3.5–5.2)
Alkaline Phosphatase: 91 U/L (ref 39–117)
BUN: 11 mg/dL (ref 6–23)
CO2: 29 mEq/L (ref 19–32)
Calcium: 9 mg/dL (ref 8.4–10.5)
Chloride: 102 mEq/L (ref 96–112)
Creatinine, Ser: 0.77 mg/dL (ref 0.40–1.20)
GFR: 81.08 mL/min (ref >60.00)
Glucose, Bld: 120 mg/dL — ABNORMAL HIGH (ref 70–99)
Potassium: 4.3 mEq/L (ref 3.5–5.1)
Sodium: 136 mEq/L (ref 135–145)
Total Bilirubin: 0.6 mg/dL (ref 0.2–1.2)
Total Protein: 7.4 g/dL (ref 6.0–8.3)

## 2019-11-15 LAB — B12 AND FOLATE PANEL
Folate: 11.5 ng/mL (ref >5.9)
Vitamin B-12: 310 pg/mL (ref 211–911)

## 2019-11-15 LAB — MICROALBUMIN / CREATININE URINE RATIO
Creatinine,U: 172 mg/dL
Microalb Creat Ratio: 0.6 mg/g (ref 0.0–30.0)
Microalb, Ur: 1.1 mg/dL (ref 0.0–1.9)

## 2019-11-15 LAB — HEMOGLOBIN A1C: Hgb A1c MFr Bld: 6.8 % — ABNORMAL HIGH (ref 4.6–6.5)

## 2019-11-15 LAB — VITAMIN D 25 HYDROXY (VIT D DEFICIENCY, FRACTURES): VITD: 19.42 ng/mL — ABNORMAL LOW (ref 30.00–100.00)

## 2019-11-15 LAB — TSH: TSH: 1.3 u[IU]/mL (ref 0.35–4.50)

## 2019-11-18 LAB — HIV ANTIBODY (ROUTINE TESTING W REFLEX): HIV 1&2 Ab, 4th Generation: NONREACTIVE

## 2019-11-18 LAB — HEPATITIS C ANTIBODY
Hepatitis C Ab: NONREACTIVE
SIGNAL TO CUT-OFF: 0.01 (ref <1.00)

## 2019-11-25 ENCOUNTER — Ambulatory Visit: Payer: BC Managed Care – PPO | Admitting: Nurse Practitioner

## 2019-11-26 ENCOUNTER — Encounter: Payer: Self-pay | Admitting: Nurse Practitioner

## 2019-11-29 ENCOUNTER — Encounter: Payer: Self-pay | Admitting: Nurse Practitioner

## 2019-11-29 ENCOUNTER — Telehealth: Payer: Self-pay | Admitting: Nurse Practitioner

## 2019-11-29 ENCOUNTER — Telehealth (INDEPENDENT_AMBULATORY_CARE_PROVIDER_SITE_OTHER): Payer: BC Managed Care – PPO | Admitting: Nurse Practitioner

## 2019-11-29 ENCOUNTER — Other Ambulatory Visit: Payer: Self-pay

## 2019-11-29 VITALS — Ht 65.0 in | Wt 254.0 lb

## 2019-11-29 DIAGNOSIS — F17200 Nicotine dependence, unspecified, uncomplicated: Secondary | ICD-10-CM | POA: Insufficient documentation

## 2019-11-29 DIAGNOSIS — E785 Hyperlipidemia, unspecified: Secondary | ICD-10-CM | POA: Diagnosis not present

## 2019-11-29 DIAGNOSIS — E119 Type 2 diabetes mellitus without complications: Secondary | ICD-10-CM | POA: Diagnosis not present

## 2019-11-29 MED ORDER — ROSUVASTATIN CALCIUM 5 MG PO TABS: 5.0000 mg | ORAL_TABLET | Freq: Every day | ORAL | 3 refills | Status: DC

## 2019-11-29 NOTE — Telephone Encounter (Signed)
Patient returned your call.

## 2019-11-29 NOTE — Telephone Encounter (Addendum)
PLAN: She needs OV in 3 mos since we increased her Metformin to BID> She will also be 44 and we can discuss CC screening.

## 2019-11-29 NOTE — Patient Instructions (Addendum)
We reviewed her laboratory studies today.  Reviewed your blood sugars on Metformin 500 mg daily.  Please increase Metformin to 500 mg with breakfast and with supper.  Monitor for GI distress, and this should resolve over time.  I think this dose will get you to goal.  Please continue to check fasting blood sugar about 3 times a week and record.  Also spot  Check x3 per weeks a 2 hours after supper blood sugar.  Continue to work on diet and exercise.  I placed a referral placed for current weight management team.  Right frozen shoulder- AC joint arthritis.  We will look into orthopedic referral. You can ask them about the bony knot on your left foot , as well- negative xray.   Tobacco use: Needs to stop when you are able.  Looking to quit line [place] phone: [phone] Try the nicotine gum in place of 3-4 cigarettes per day. Chew and park explained.   Diabetes Basics  Diabetes (diabetes mellitus) is a long-term (chronic) disease. It occurs when the body does not properly use sugar (glucose) that is released from food after you eat. Diabetes may be caused by one or both of these problems:  Your pancreas does not make enough of a hormone called insulin.  Your body does not react in a normal way to insulin that it makes. Insulin lets sugars (glucose) go into cells in your body. This gives you energy. If you have diabetes, sugars cannot get into cells. This causes high blood sugar (hyperglycemia). Follow these instructions at home: How is diabetes treated? You may need to take insulin or other diabetes medicines daily to keep your blood sugar in balance. Take your diabetes medicines every day as told by your doctor. List your diabetes medicines here: Diabetes medicines  Name of medicine: ______________________________ ? Amount (dose): _______________ Time (a.m./p.m.): _______________ Notes: ___________________________________  Name of medicine:  ______________________________ ? Amount (dose): _______________ Time (a.m./p.m.): _______________ Notes: ___________________________________  Name of medicine: ______________________________ ? Amount (dose): _______________ Time (a.m./p.m.): _______________ Notes: ___________________________________ If you use insulin, you will learn how to give yourself insulin by injection. You may need to adjust the amount based on the food that you eat. List the types of insulin you use here: Insulin  Insulin type: ______________________________ ? Amount (dose): _______________ Time (a.m./p.m.): _______________ Notes: ___________________________________  Insulin type: ______________________________ ? Amount (dose): _______________ Time (a.m./p.m.): _______________ Notes: ___________________________________  Insulin type: ______________________________ ? Amount (dose): _______________ Time (a.m./p.m.): _______________ Notes: ___________________________________  Insulin type: ______________________________ ? Amount (dose): _______________ Time (a.m./p.m.): _______________ Notes: ___________________________________  Insulin type: ______________________________ ? Amount (dose): _______________ Time (a.m./p.m.): _______________ Notes: ___________________________________ How do I manage my blood sugar?  Check your blood sugar levels using a blood glucose monitor as directed by your doctor. Your doctor will set treatment goals for you. Generally, you should have these blood sugar levels:  Before meals (preprandial): 80-130 mg/dL (6.6-4.4 mmol/L).  After meals (postprandial): below 180 mg/dL (10 mmol/L).  A1c level: less than 7%. Write down the times that you will check your blood sugar levels: Blood sugar checks  Time: _______________ Notes: ___________________________________  Time: _______________ Notes: ___________________________________  Time: _______________ Notes:  ___________________________________  Time: _______________ Notes: ___________________________________  Time: _______________ Notes: ___________________________________  Time: _______________ Notes: ___________________________________  What do I need to know about low blood sugar? Low blood sugar is called hypoglycemia. This is when blood sugar is at or below 70 mg/dL (3.9 mmol/L). Symptoms may include:  Feeling: ? Hungry. ? Worried  or nervous (anxious). ? Sweaty and clammy. ? Confused. ? Dizzy. ? Sleepy. ? Sick to your stomach (nauseous).  Having: ? A fast heartbeat. ? A headache. ? A change in your vision. ? Tingling or no feeling (numbness) around the mouth, lips, or tongue. ? Jerky movements that you cannot control (seizure).  Having trouble with: ? Moving (coordination). ? Sleeping. ? Passing out (fainting). ? Getting upset easily (irritability). Treating low blood sugar To treat low blood sugar, eat or drink something sugary right away. If you can think clearly and swallow safely, follow the 15:15 rule:  Take 15 grams of a fast-acting carb (carbohydrate). Talk with your doctor about how much you should take.  Some fast-acting carbs are: ? Sugar tablets (glucose pills). Take 3-4 glucose pills. ? 6-8 pieces of hard candy. ? 4-6 oz (120-150 mL) of fruit juice. ? 4-6 oz (120-150 mL) of regular (not diet) soda. ? 1 Tbsp (15 mL) honey or sugar.  Check your blood sugar 15 minutes after you take the carb.  If your blood sugar is still at or below 70 mg/dL (3.9 mmol/L), take 15 grams of a carb again.  If your blood sugar does not go above 70 mg/dL (3.9 mmol/L) after 3 tries, get help right away.  After your blood sugar goes back to normal, eat a meal or a snack within 1 hour. Treating very low blood sugar If your blood sugar is at or below 54 mg/dL (3 mmol/L), you have very low blood sugar (severe hypoglycemia). This is an emergency. Do not wait to see if the symptoms  will go away. Get medical help right away. Call your local emergency services (911 in the [place] Do not drive yourself to the hospital. Questions to ask your health care provider  Do I need to meet with a diabetes educator?  What equipment will I need to care for myself at home?  What diabetes medicines do I need? When should I take them?  How often do I need to check my blood sugar?  What number can I call if I have questions?  When is my next doctor's visit?  Where can I find a support group for people with diabetes? Where to find more information  American Diabetes Association: www.diabetes.org  American Association of Diabetes Educators: www.diabeteseducator.org/patient-resources Contact a doctor if:  Your blood sugar is at or above 240 mg/dL (40.9 mmol/L) for 2 days in a row.  You have been sick or have had a fever for 2 days or more, and you are not getting better.  You have any of these problems for more than 6 hours: ? You cannot eat or drink. ? You feel sick to your stomach (nauseous). ? You throw up (vomit). ? You have watery poop (diarrhea). Get help right away if:  Your blood sugar is lower than 54 mg/dL (3 mmol/L).  You get confused.  You have trouble: ? Thinking clearly. ? Breathing. Summary  Diabetes (diabetes mellitus) is a long-term (chronic) disease. It occurs when the body does not properly use sugar (glucose) that is released from food after digestion.  Take insulin and diabetes medicines as told.  Check your blood sugar every day, as often as told.  Keep all follow-up visits as told by your doctor. This is important. This information is not intended to replace advice given to you by your health care provider. Make sure you discuss any questions you have with your health care provider. Document Revised: 11/14/2018 Document Reviewed: 05/26/2017  Elsevier Patient Education  [employer].  Diabetes Mellitus and Nutrition, Adult When you  have diabetes (diabetes mellitus), it is very important to have healthy eating habits because your blood sugar (glucose) levels are greatly affected by what you eat and drink. Eating healthy foods in the appropriate amounts, at about the same times every day, can help you:  Control your blood glucose.  Lower your risk of heart disease.  Improve your blood pressure.  Reach or maintain a healthy weight. Every person with diabetes is different, and each person has different needs for a meal plan. Your health care provider may recommend that you work with a diet and nutrition specialist (dietitian) to make a meal plan that is best for you. Your meal plan may vary depending on factors such as:  The calories you need.  The medicines you take.  Your weight.  Your blood glucose, blood pressure, and cholesterol levels.  Your activity level.  Other health conditions you have, such as heart or kidney disease. How do carbohydrates affect me? Carbohydrates, also called carbs, affect your blood glucose level more than any other type of food. Eating carbs naturally raises the amount of glucose in your blood. Carb counting is a method for keeping track of how many carbs you eat. Counting carbs is important to keep your blood glucose at a healthy level, especially if you use insulin or take certain oral diabetes medicines. It is important to know how many carbs you can safely have in each meal. This is different for every person. Your dietitian can help you calculate how many carbs you should have at each meal and for each snack. Foods that contain carbs include:  Bread, cereal, rice, pasta, and crackers.  Potatoes and corn.  Peas, beans, and lentils.  Milk and yogurt.  Fruit and juice.  Desserts, such as cakes, cookies, ice cream, and candy. How does alcohol affect me? Alcohol can cause a sudden decrease in blood glucose (hypoglycemia), especially if you use insulin or take certain oral  diabetes medicines. Hypoglycemia can be a life-threatening condition. Symptoms of hypoglycemia (sleepiness, dizziness, and confusion) are similar to symptoms of having too much alcohol. If your health care provider says that alcohol is safe for you, follow these guidelines:  Limit alcohol intake to no more than 1 drink per day for nonpregnant women and 2 drinks per day for men. One drink equals 12 oz of beer, 5 oz of wine, or 1 oz of hard liquor.  Do not drink on an empty stomach.  Keep yourself hydrated with water, diet soda, or unsweetened iced tea.  Keep in mind that regular soda, juice, and other mixers may contain a lot of sugar and must be counted as carbs. What are tips for following this plan?  Reading food labels  Start by checking the serving size on the "Nutrition Facts" label of packaged foods and drinks. The amount of calories, carbs, fats, and other nutrients listed on the label is based on one serving of the item. Many items contain more than one serving per package.  Check the total grams (g) of carbs in one serving. You can calculate the number of servings of carbs in one serving by dividing the total carbs by 15. For example, if a food has 30 g of total carbs, it would be equal to 2 servings of carbs.  Check the number of grams (g) of saturated and trans fats in one serving. Choose foods that have low or no  amount of these fats.  Check the number of milligrams (mg) of salt (sodium) in one serving. Most people should limit total sodium intake to less than 2,300 mg per day.  Always check the nutrition information of foods labeled as "low-fat" or "nonfat". These foods may be higher in added sugar or refined carbs and should be avoided.  Talk to your dietitian to identify your daily goals for nutrients listed on the label. Shopping  Avoid buying canned, premade, or processed foods. These foods tend to be high in fat, sodium, and added sugar.  Shop around the outside edge  of the grocery store. This includes fresh fruits and vegetables, bulk grains, fresh meats, and fresh dairy. Cooking  Use low-heat cooking methods, such as baking, instead of high-heat cooking methods like deep frying.  Cook using healthy oils, such as olive, canola, or sunflower oil.  Avoid cooking with butter, cream, or high-fat meats. Meal planning  Eat meals and snacks regularly, preferably at the same times every day. Avoid going long periods of time without eating.  Eat foods high in fiber, such as fresh fruits, vegetables, beans, and whole grains. Talk to your dietitian about how many servings of carbs you can eat at each meal.  Eat 4-6 ounces (oz) of lean protein each day, such as lean meat, chicken, fish, eggs, or tofu. One oz of lean protein is equal to: ? 1 oz of meat, chicken, or fish. ? 1 egg. ?  cup of tofu.  Eat some foods each day that contain healthy fats, such as avocado, nuts, seeds, and fish. Lifestyle  Check your blood glucose regularly.  Exercise regularly as told by your health care provider. This may include: ? 150 minutes of moderate-intensity or vigorous-intensity exercise each week. This could be brisk walking, biking, or water aerobics. ? Stretching and doing strength exercises, such as yoga or weightlifting, at least 2 times a week.  Take medicines as told by your health care provider.  Do not use any products that contain nicotine or tobacco, such as cigarettes and e-cigarettes. If you need help quitting, ask your health care provider.  Work with a Veterinary surgeon or diabetes educator to identify strategies to manage stress and any emotional and social challenges. Questions to ask a health care provider  Do I need to meet with a diabetes educator?  Do I need to meet with a dietitian?  What number can I call if I have questions?  When are the best times to check my blood glucose? Where to find more information:  American Diabetes Association:  diabetes.org  Academy of Nutrition and Dietetics: www.eatright.[employer] of Diabetes and Digestive and Kidney Diseases (NIH): Car[employer].tn Summary  A healthy meal plan will help you control your blood glucose and maintain a healthy lifestyle.  Working with a diet and nutrition specialist (dietitian) can help you make a meal plan that is best for you.  Keep in mind that carbohydrates (carbs) and alcohol have immediate effects on your blood glucose levels. It is important to count carbs and to use alcohol carefully. This information is not intended to replace advice given to you by your health care provider. Make sure you discuss any questions you have with your health care provider. Document Revised: 02/03/2017 Document Reviewed: 03/28/2016 Elsevier Patient Education  2020 Elsevier Inc.  Dyslipidemia Dyslipidemia is an imbalance of waxy, fat-like substances (lipids) in the blood. The body needs lipids in small amounts. Dyslipidemia often involves a high level of cholesterol or  triglycerides, which are types of lipids. Common forms of dyslipidemia include:  High levels of LDL cholesterol. LDL is the type of cholesterol that causes fatty deposits (plaques) to build up in the blood vessels that carry blood away from your heart (arteries).  Low levels of HDL cholesterol. HDL cholesterol is the type of cholesterol that protects against heart disease. High levels of HDL remove the LDL buildup from arteries.  High levels of triglycerides. Triglycerides are a fatty substance in the blood that is linked to a buildup of plaques in the arteries. What are the causes? Primary dyslipidemia is caused by changes (mutations) in genes that are passed down through families (inherited). These mutations cause several types of dyslipidemia. Secondary dyslipidemia is caused by lifestyle choices and diseases that lead to dyslipidemia, such as:  Eating a diet that is high in animal fat.  Not  getting enough exercise.  Having diabetes, kidney disease, liver disease, or thyroid disease.  Drinking large amounts of alcohol.  Using certain medicines. What increases the risk? You are more likely to develop this condition if you are an older man or if you are a woman who has gone through menopause. Other risk factors include:  Having a family history of dyslipidemia.  Taking certain medicines, including birth control pills, steroids, some diuretics, and beta-blockers.  Smoking cigarettes.  Eating a high-fat diet.  Having certain medical conditions such as diabetes, polycystic ovary syndrome (PCOS), kidney disease, liver disease, or hypothyroidism.  Not exercising regularly.  Being overweight or obese with too much belly fat. What are the signs or symptoms? In most cases, dyslipidemia does not usually cause any symptoms. In severe cases, very high lipid levels can cause:  Fatty bumps under the skin (xanthomas).  White or gray ring around the black center (pupil) of the eye. Very high triglyceride levels can cause inflammation of the pancreas (pancreatitis). How is this diagnosed? Your health care provider may diagnose dyslipidemia based on a routine blood test (fasting blood test). Because most people do not have symptoms of the condition, this blood testing (lipid profile) is done on adults age 46 and older and is repeated every 5 years. This test checks:  Total cholesterol. This measures the total amount of cholesterol in your blood, including LDL cholesterol, HDL cholesterol, and triglycerides. A healthy number is below 200.  LDL cholesterol. The target number for LDL cholesterol is different for each person, depending on individual risk factors. Ask your health care provider what your LDL cholesterol should be.  HDL cholesterol. An HDL level of 60 or higher is best because it helps to protect against heart disease. A number below 40 for men or below 50 for women  increases the risk for heart disease.  Triglycerides. A healthy triglyceride number is below 150. If your lipid profile is abnormal, your health care provider may do other blood tests. How is this treated? Treatment depends on the type of dyslipidemia that you have and your other risk factors for heart disease and stroke. Your health care provider will have a target range for your lipid levels based on this information. For many people, this condition may be treated by lifestyle changes, such as diet and exercise. Your health care provider may recommend that you:  Get regular exercise.  Make changes to your diet.  Quit smoking if you smoke. If diet changes and exercise do not help you reach your goals, your health care provider may also prescribe medicine to lower lipids. The most commonly prescribed  type of medicine lowers your LDL cholesterol (statin drug). If you have a high triglyceride level, your provider may prescribe another type of drug (fibrate) or an omega-3 fish oil supplement, or both. Follow these instructions at home:  Eating and drinking  Follow instructions from your health care provider or dietitian about eating or drinking restrictions.  Eat a healthy diet as told by your health care provider. This can help you reach and maintain a healthy weight, lower your LDL cholesterol, and raise your HDL cholesterol. This may include: ? Limiting your calories, if you are overweight. ? Eating more fruits, vegetables, whole grains, fish, and lean meats. ? Limiting saturated fat, trans fat, and cholesterol.  If you drink alcohol: ? Limit how much you use. ? Be aware of how much alcohol is in your drink. In the [place] one drink equals one 12 oz bottle of beer (355 mL), one 5 oz glass of wine (148 mL), or one 1 oz glass of hard liquor (44 mL).  Do not drink alcohol if: ? Your health care provider tells you not to drink. ? You are pregnant, may be pregnant, or are planning to become  pregnant. Activity  Get regular exercise. Start an exercise and strength training program as told by your health care provider. Ask your health care provider what activities are safe for you. Your health care provider may recommend: ? 30 minutes of aerobic activity 4-6 days a week. Brisk walking is an example of aerobic activity. ? Strength training 2 days a week. General instructions  Do not use any products that contain nicotine or tobacco, such as cigarettes, e-cigarettes, and chewing tobacco. If you need help quitting, ask your health care provider.  Take over-the-counter and prescription medicines only as told by your health care provider. This includes supplements.  Keep all follow-up visits as told by your health care provider. Contact a health care provider if:  You are: ? Having trouble sticking to your exercise or diet plan. ? Struggling to quit smoking or control your use of alcohol. Summary  Dyslipidemia often involves a high level of cholesterol or triglycerides, which are types of lipids.  Treatment depends on the type of dyslipidemia that you have and your other risk factors for heart disease and stroke.  For many people, treatment starts with lifestyle changes, such as diet and exercise.  Your health care provider may prescribe medicine to lower lipids. This information is not intended to replace advice given to you by your health care provider. Make sure you discuss any questions you have with your health care provider. Document Revised: 10/16/2017 Document Reviewed: 09/22/2017 Elsevier Patient Education  2020 [employer].  Steps to Quit Smoking Smoking tobacco is the leading cause of preventable death. It can affect almost every organ in the body. Smoking puts you and those around you at risk for developing many serious chronic diseases. Quitting smoking can be difficult, but it is one of the best things that you can do for your health. It is never too late to  quit. How do I get ready to quit? When you decide to quit smoking, create a plan to help you succeed. Before you quit:  Pick a date to quit. Set a date within the next 2 weeks to give you time to prepare.  Write down the reasons why you are quitting. Keep this list in places where you will see it often.  Tell your family, friends, and co-workers that you are quitting. Support from your  loved ones can make quitting easier.  Talk with your health care provider about your options for quitting smoking.  Find out what treatment options are covered by your health insurance.  Identify people, places, things, and activities that make you want to smoke (triggers). Avoid them. What first steps can I take to quit smoking?  Throw away all cigarettes at home, at work, and in your car.  Throw away smoking accessories, such as Set designerashtrays and lighters.  Clean your car. Make sure to empty the ashtray.  Clean your home, including curtains and carpets. What strategies can I use to quit smoking? Talk with your health care provider about combining strategies, such as taking medicines while you are also receiving in-person counseling. Using these two strategies together makes you more likely to succeed in quitting than if you used either strategy on its own.  If you are pregnant or breastfeeding, talk with your health care provider about finding counseling or other support strategies to quit smoking. Do not take medicine to help you quit smoking unless your health care provider tells you to do so. To quit smoking: Quit right away  Quit smoking completely, instead of gradually reducing how much you smoke over a period of time. Research shows that stopping smoking right away is more successful than gradually quitting.  Attend in-person counseling to help you build problem-solving skills. You are more likely to succeed in quitting if you attend counseling sessions regularly. Even short sessions of 10 minutes  can be effective. Take medicine You may take medicines to help you quit smoking. Some medicines require a prescription and some you can purchase over-the-counter. Medicines may have nicotine in them to replace the nicotine in cigarettes. Medicines may:  Help to stop cravings.  Help to relieve withdrawal symptoms. Your health care provider may recommend:  Nicotine patches, gum, or lozenges.  Nicotine inhalers or sprays.  Non-nicotine medicine that is taken by mouth. Find resources Find resources and support systems that can help you to quit smoking and remain smoke-free after you quit. These resources are most helpful when you use them often. They include:  Online chats with a Veterinary surgeoncounselor.  Telephone quitlines.  Printed Materials engineerself-help materials.  Support groups or group counseling.  Text messaging programs.  Mobile phone apps or applications. Use apps that can help you stick to your quit plan by providing reminders, tips, and encouragement. There are many free apps for mobile devices as well as websites. Examples include Quit Guide from the [employer] and smokefree.gov What things can I do to make it easier to quit?   Reach out to your family and friends for support and encouragement. Call telephone quitlines (1-800-QUIT-NOW), reach out to support groups, or work with a counselor for support.  Ask people who smoke to avoid smoking around you.  Avoid places that trigger you to smoke, such as bars, parties, or smoke-break areas at work.  Spend time with people who do not smoke.  Lessen the stress in your life. Stress can be a smoking trigger for some people. To lessen stress, try: ? Exercising regularly. ? Doing deep-breathing exercises. ? Doing yoga. ? Meditating. ? Performing a body scan. This involves closing your eyes, scanning your body from head to toe, and noticing which parts of your body are particularly tense. Try to relax the muscles in those areas. How will I feel when I quit  smoking? Day 1 to 3 weeks Within the first 24 hours of quitting smoking, you may start to feel  withdrawal symptoms. These symptoms are usually most noticeable 2-3 days after quitting, but they usually do not last for more than 2-3 weeks. You may experience these symptoms:  Mood swings.  Restlessness, anxiety, or irritability.  Trouble concentrating.  Dizziness.  Strong cravings for sugary foods and nicotine.  Mild weight gain.  Constipation.  Nausea.  Coughing or a sore throat.  Changes in how the medicines that you take for unrelated issues work in your body.  Depression.  Trouble sleeping (insomnia). Week 3 and afterward After the first 2-3 weeks of quitting, you may start to notice more positive results, such as:  Improved sense of smell and taste.  Decreased coughing and sore throat.  Slower heart rate.  Lower blood pressure.  Clearer skin.  The ability to breathe more easily.  Fewer sick days. Quitting smoking can be very challenging. Do not get discouraged if you are not successful the first time. Some people need to make many attempts to quit before they achieve long-term success. Do your best to stick to your quit plan, and talk with your health care provider if you have any questions or concerns. Summary  Smoking tobacco is the leading cause of preventable death. Quitting smoking is one of the best things that you can do for your health.  When you decide to quit smoking, create a plan to help you succeed.  Quit smoking right away, not slowly over a period of time.  When you start quitting, seek help from your health care provider, family, or friends. This information is not intended to replace advice given to you by your health care provider. Make sure you discuss any questions you have with your health care provider. Document Revised: 11/16/2018 Document Reviewed: 05/12/2018 Elsevier Patient Education  2020 [employer].

## 2019-11-29 NOTE — Progress Notes (Signed)
Virtual Visit via video note  This visit type was conducted due to national recommendations for restrictions regarding the COVID-19 pandemic (e.g. social distancing).  This format is felt to be most appropriate for this patient at this time.  All issues noted in this document were discussed and addressed.  No physical exam was performed (except for noted visual exam findings with Video Visits).   I connected with@ on 11/29/19 at  8:00 AM EDT by a video enabled telemedicine application or telephone and verified that I am speaking with the correct person using two identifiers. Location patient: home Location provider: work or home office Persons participating in the virtual visit: patient, provider  I discussed the limitations, risks, security and privacy concerns of performing an evaluation and management service by telephone and the availability of in person appointments. I also discussed with the patient that there may be a patient responsible charge related to this service. The patient expressed understanding and agreed to proceed.   Reason for visit: Follow-up on new diagnosis diabetes mellitus, hyperlipidemia, morbid obesity, right shoulder pain.  HPI: This 44 year old patient establish care last week with above problems.   Type 2 diabetes: Recent diagnosis with A1c 6.8 she is tolerating the Metformin 500 mg before breakfast with breakfast well.  Reports fasting blood sugars are 120-130 and bedtime blood sugars 140-160.  She has noted no diarrhea or GI concerns.  BMI 42/morbid obesity: Patient is interested in getting connected with the [facility] team.  She does want help in managing her diet and weight loss.  This is in the setting of diabetes and hyperlipidemia.  Wt Readings from Last 3 Encounters:  11/29/19 254 lb (115.2 kg)  11/08/19 253 lb (114.8 kg)  02/14/18 240 lb (108.9 kg)   Hyperlipidemia: Lipid panel reveals LDL of 120, triglycerides 178 patient will work  on diet and exercise.  Begin Crestor 5 mg daily today and she is in agreement with this.  Lab Results  Component Value Date   CHOL 199 11/15/2019   HDL 42.50 11/15/2019   LDLCALC 120 (H) 11/15/2019   TRIG 178.0 (H) 11/15/2019   CHOLHDL 5 11/15/2019    Frozen right shoulder: The x-ray obtained 11/08/2019 showed mild arthropathy of the right AC joint.  Patient still cannot lift her shoulder up, place her shoulder behind her back, and has sharp pain with certain movements.  States she does not have a chronic ache but she will take a couple Tylenol every now and then.  She has not heard from orthopedic referral.  Left foot pain: No reports of left foot complaints today.  X-ray showed was negative.  Nicotine use disorder: Patient smokes half a pack a day for 12 and half years.     See pertinent positives and negatives per HPI.  Past Medical History:  Diagnosis Date  . Chicken pox   . Diabetes mellitus without complication [facility])     Past Surgical History:  Procedure Laterality Date  . GALLBLADDER SURGERY      Family History  Problem Relation Age of Onset  . Hyperlipidemia Mother   . Hypertension Father   . Diabetes Father   . Stroke Father   . Miscarriages / Stillbirths Sister   . Arthritis Paternal Grandmother   . Diabetes Paternal Grandmother   . Breast cancer Neg Hx     SOCIAL HX: Current everyday cigarette user 1/2 pack a day for 12-1/2 years   Current Outpatient Medications:  .  blood glucose meter  kit and supplies, Dispense based on patient and insurance preference. Use up to four times daily as directed. (FOR ICD-10 E10.9, E11.9)., Disp: 1 each, Rfl: 0 .  levonorgestrel (MIRENA) 20 MCG/24HR IUD, by Intrauterine route., Disp: , Rfl:  .  metFORMIN (GLUCOPHAGE XR) 500 MG 24 hr tablet, Start 518m PO qpm., Disp: 90 tablet, Rfl: 3 .  rosuvastatin (CRESTOR) 5 MG tablet, Take 1 tablet (5 mg total) by mouth daily., Disp: 90 tablet, Rfl: 3  EXAM:  VITALS per patient if  applicable:  GENERAL: alert, oriented, appears well and in no acute distress  HEENT: atraumatic, conjunctivae clear, no obvious abnormalities on inspection of external nose and ears  NECK: normal movements of the head and neck  LUNGS: on inspection no signs of respiratory distress, breathing rate appears normal, no obvious gross SOB, gasping or wheezing  CV: no obvious cyanosis  MS: moves all visible extremities without noticeable abnormality  PSYCH/NEURO: pleasant and cooperative, no obvious depression or anxiety, speech and thought processing grossly intact  ASSESSMENT AND PLAN:  Discussed the following assessment and plan:  Type 2 diabetes mellitus without complication, without long-term current use of insulin (HCC) - Plan: Amb Ref to Medical Weight Management  Hyperlipidemia, unspecified hyperlipidemia type - Plan: Amb Ref to Medical Weight Management, Hemoglobin A1c, Comprehensive metabolic panel, Lipid panel  Morbid obesity ([place] - Plan: Amb Ref to Medical Weight Management  Nicotine use disorder  No problem-specific Assessment & Plan notes found for this encounter.  We reviewed her laboratory studies today.  Reviewed blood sugars on Metformin 500 mg daily. Increase Metformin to 500 mg with breakfast and with supper. Check fasting blood sugar about 3 times a week and record.  Also spot check x3 per weeks a 2 hours after supper blood sugar.  Continue to work on diet and exercise.  Referral placed for current weight management team.   Right frozen shoulder- AC joint arthritis.  We will look into orthopedic referral. You can ask them about the bony knot on your left foot , as well- negative xray.   Tobacco use: Advised cessation.  Quit line [employer]: [phone] the nicotine gum in place of 3-4 cigarettes per day. Chew and park in cheek explained.   I discussed the assessment and treatment plan with the patient. The patient was provided an opportunity to  ask questions and all were answered. The patient agreed with the plan and demonstrated an understanding of the instructions.   The patient was advised to call back or seek an in-person evaluation if the symptoms worsen or if the condition fails to improve as anticipated.   KDenice Paradise NP Adult Nurse Practitioner [phone]

## 2019-12-01 ENCOUNTER — Encounter: Payer: Self-pay | Admitting: Nurse Practitioner

## 2019-12-02 NOTE — Telephone Encounter (Signed)
Patient scheduled 3 month OV on 03/02/20 at 9:00 am

## 2019-12-10 ENCOUNTER — Ambulatory Visit: Payer: BC Managed Care – PPO | Admitting: Dietician

## 2019-12-13 ENCOUNTER — Ambulatory Visit: Payer: BC Managed Care – PPO | Admitting: Dietician

## 2020-01-27 ENCOUNTER — Other Ambulatory Visit: Payer: Self-pay

## 2020-01-29 ENCOUNTER — Encounter: Payer: Self-pay | Admitting: Nurse Practitioner

## 2020-01-29 ENCOUNTER — Other Ambulatory Visit: Payer: Self-pay

## 2020-01-29 ENCOUNTER — Ambulatory Visit: Payer: BC Managed Care – PPO | Admitting: Nurse Practitioner

## 2020-01-29 VITALS — BP 122/88 | HR 108 | Temp 98.1°F | Ht 65.0 in | Wt 252.0 lb

## 2020-01-29 DIAGNOSIS — E119 Type 2 diabetes mellitus without complications: Secondary | ICD-10-CM | POA: Diagnosis not present

## 2020-01-29 DIAGNOSIS — F17200 Nicotine dependence, unspecified, uncomplicated: Secondary | ICD-10-CM

## 2020-01-29 DIAGNOSIS — E785 Hyperlipidemia, unspecified: Secondary | ICD-10-CM

## 2020-01-29 DIAGNOSIS — Z23 Encounter for immunization: Secondary | ICD-10-CM | POA: Diagnosis not present

## 2020-01-29 LAB — COMPREHENSIVE METABOLIC PANEL WITH GFR
ALT: 27 U/L (ref 0–35)
AST: 28 U/L (ref 0–37)
Albumin: 4.1 g/dL (ref 3.5–5.2)
Alkaline Phosphatase: 91 U/L (ref 39–117)
BUN: 15 mg/dL (ref 6–23)
CO2: 27 meq/L (ref 19–32)
Calcium: 9.3 mg/dL (ref 8.4–10.5)
Chloride: 102 meq/L (ref 96–112)
Creatinine, Ser: 0.72 mg/dL (ref 0.40–1.20)
GFR: 101.16 mL/min
Glucose, Bld: 117 mg/dL — ABNORMAL HIGH (ref 70–99)
Potassium: 4.8 meq/L (ref 3.5–5.1)
Sodium: 136 meq/L (ref 135–145)
Total Bilirubin: 0.5 mg/dL (ref 0.2–1.2)
Total Protein: 7.3 g/dL (ref 6.0–8.3)

## 2020-01-29 LAB — HEPATIC FUNCTION PANEL
ALT: 27 U/L (ref 0–35)
AST: 28 U/L (ref 0–37)
Albumin: 4.1 g/dL (ref 3.5–5.2)
Alkaline Phosphatase: 91 U/L (ref 39–117)
Bilirubin, Direct: 0.1 mg/dL (ref 0.0–0.3)
Total Bilirubin: 0.5 mg/dL (ref 0.2–1.2)
Total Protein: 7.3 g/dL (ref 6.0–8.3)

## 2020-01-29 MED ORDER — VITAMIN D (ERGOCALCIFEROL) 1.25 MG (50000 UNIT) PO CAPS: 50000.0000 [IU] | ORAL_CAPSULE | ORAL | 0 refills | Status: DC

## 2020-01-29 MED ORDER — OZEMPIC (0.25 OR 0.5 MG/DOSE) 2 MG/1.5ML ~~LOC~~ SOPN: 0.2500 mg | PEN_INJECTOR | SUBCUTANEOUS | 1 refills | Status: DC

## 2020-01-29 NOTE — Progress Notes (Signed)
Established Patient Office Visit  Subjective:  Patient ID: Colleen Wagner, female    DOB: [birth year]  Age: 44 y.o. MRN: [id number]  CC:  Chief Complaint  Patient presents with  . Follow-up    HPI Colleen Wagner is a 44 yo who established care in September for new onset diabetes mellitus with A1c 7.3.  She also has a history of hyperlipidemia.  She presents for 45-monthfollow-up.    T2DM: Patient had an A1c of 6.3 in 2017.  Recent hemoglobin A1c  7.4 with new diagnosis diabetes mellitus.  She was started on Metformin XR 500 mg twice daily is tolerating the medication well.  She has fasting blood sugar 125-130. [facility] reports no abnormal findings.  Foot exam today is normal.  She declined ambulatory referral to medical weight loss management team.  She has declined formal diabetes teaching. 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 She feels knowledgeable  about diabetes care and diet. She has working on weight loss.   Lab Results  Component Value Date   MICROALBUR 1.1 11/15/2019    Lab Results  Component Value Date   HGBA1C 6.8 (H) 11/15/2019    BMI 41.93/morbid obesity/HLD: The 10-year ASCVD risk score (Mikey BussingDC Jr., et al., 2013) is: 7.1%.  She is taking rosuvastatin 5 mg daily.  Tolerating medication well without myalgias.  We plan to titrate up the dose as she tolerates.  Wt Readings from Last 3 Encounters:  01/29/20 252 lb (114.3 kg)  11/29/19 254 lb (115.2 kg)  11/08/19 253 lb (114.8 kg)   Vit D deficiency:  Vit D 19: Advised replacement  Immunizations: Due for Covid, Flu and pneumo 23 vaccines.  Counseled regarding all 3 and she will consider.  Tdap- needs done.  Diet: Trying to follow a diabetic healthy diet. Exercise: No regular exercise. Colonoscopy: Due for colon cancer screening. Declines for now. Dexa: Pap Smear: Performed 08/14/2019: Epithelial cell abnormality ASCUS, HPV positive  followed by GYN.  Birth control uses IUD. Mammogram: 08/15/2019 normal, repeat 1 year followed by GYN Tobacco use: Smoking 1/2 pack/day for 12.5 years and  now down to 2-3 cigarettes/day sometimes none-cold [place]  Vision:  [facility] findings Dentist: Appt arranged for Jan    Past Medical History:  Diagnosis Date  . Chicken pox   . Diabetes mellitus without complication ([facility]     Past Surgical History:  Procedure Laterality Date  . GALLBLADDER SURGERY      Family History  Problem Relation Age of Onset  . Hyperlipidemia Mother   . Hypertension Father   . Diabetes Father   . Stroke Father   . Miscarriages / Stillbirths Sister   . Arthritis Paternal Grandmother   . Diabetes Paternal Grandmother   . Breast cancer Neg Hx     Social History   Socioeconomic History  . Marital status: Divorced    Spouse name: Not on file  . Number of children: Not on file  . Years of education: Not on file  . Highest education level: Bachelor's degree (e.g., BA, AB, BS)  Occupational History  . Not on file  Tobacco Use  . Smoking status: Current Every Day Smoker    Packs/day: 0.50    Years: 25.00    Pack years: 12.50    Types: Cigarettes  . Smokeless  tobacco: Never Used  . Tobacco comment: Enjoys smoking  Vaping Use  . Vaping Use: Former  Substance and Sexual Activity  . Alcohol use: Yes    Comment: rarely  . Drug use: Never  . Sexual activity: Yes    Birth control/protection: I.U.D.  Other Topics Concern  . Not on file  Social History Narrative   No married with 2 sons and works [employer] Conservation officer, historic buildings   Social Determinants of Radio broadcast assistant Strain:   . Difficulty of Paying Living Expenses: Not on file  Food Insecurity:   . Worried About Charity fundraiser in the Last Year: Not on file  . Ran Out of Food in the Last Year: Not on file  Transportation Needs:   . Lack of Transportation (Medical):  Not on file  . Lack of Transportation (Non-Medical): Not on file  Physical Activity:   . Days of Exercise per Week: Not on file  . Minutes of Exercise per Session: Not on file  Stress:   . Feeling of Stress : Not on file  Social Connections:   . Frequency of Communication with Friends and Family: Not on file  . Frequency of Social Gatherings with Friends and Family: Not on file  . Attends Religious Services: Not on file  . Active Member of Clubs or Organizations: Not on file  . Attends Archivist Meetings: Not on file  . Marital Status: Not on file  Intimate Partner Violence:   . Fear of Current or Ex-Partner: Not on file  . Emotionally Abused: Not on file  . Physically Abused: Not on file  . Sexually Abused: Not on file    Outpatient Medications Prior to Visit  Medication Sig Dispense Refill  . blood glucose meter kit and supplies Dispense based on patient and insurance preference. Use up to four times daily as directed. (FOR ICD-10 E10.9, E11.9). 1 each 0  . levonorgestrel (MIRENA) 20 MCG/24HR IUD by Intrauterine route.    . metFORMIN (GLUCOPHAGE XR) 500 MG 24 hr tablet Start 566m PO qpm. (Patient taking differently: Start 507mPO BID) 90 tablet 3  . rosuvastatin (CRESTOR) 5 MG tablet Take 1 tablet (5 mg total) by mouth daily. 90 tablet 3   No facility-administered medications prior to visit.    No Known Allergies  Review of Systems  Constitutional: Positive for fatigue.  HENT: Negative.   Eyes: Negative.   Respiratory: Negative.   Cardiovascular: Negative.   Gastrointestinal: Negative.   Endocrine: Negative.   Genitourinary: Negative.   Musculoskeletal:       Recent pulled muscle  lumbar strain and is taking meds from Acute care - doing better. Frozen right shoulder- treating at home with exercises- doing better. Declines  PT or Ortho referral.     Skin: Negative.   Allergic/Immunologic: Negative.   Neurological: Negative.   Hematological: Negative.     Psychiatric/Behavioral: Negative.       Objective:    Physical Exam Vitals reviewed.  Constitutional:      Appearance: She is obese.  HENT:     Head: Normocephalic.  Cardiovascular:     Rate and Rhythm: Normal rate and regular rhythm.     Pulses: Normal pulses.     Heart sounds: Normal heart sounds.  Pulmonary:     Breath sounds: Normal breath sounds. No wheezing.  Abdominal:     Palpations: Abdomen is soft.     Tenderness: There is no abdominal tenderness.  Musculoskeletal:  General: Tenderness present.     Cervical back: Normal range of motion and neck supple.  Skin:    General: Skin is warm and dry.  Neurological:     General: No focal deficit present.     Mental Status: She is alert and oriented to person, place, and time.  Psychiatric:        Mood and Affect: Mood normal.        Behavior: Behavior normal.     BP 122/88 (BP Location: Left Arm, Patient Position: Sitting, Cuff Size: Normal)   Pulse (!) 108   Temp 98.1 F (36.7 C) (Oral)   Ht _0  (1.651 m)   Wt 252 lb (114.3 kg)   SpO2 98%   BMI 41.93 kg/m  Wt Readings from Last 3 Encounters:  01/29/20 252 lb (114.3 kg)  11/29/19 254 lb (115.2 kg)  11/08/19 253 lb (114.8 kg)   Pulse Readings from Last 3 Encounters:  01/29/20 (!) 108  11/08/19 95  02/14/18 90    BP Readings from Last 3 Encounters:  01/29/20 122/88  11/08/19 120/86  02/14/18 (!) 156/100    Lab Results  Component Value Date   CHOL 199 11/15/2019   HDL 42.50 11/15/2019   LDLCALC 120 (H) 11/15/2019   TRIG 178.0 (H) 11/15/2019   CHOLHDL 5 11/15/2019      There are no preventive care reminders to display for this patient.  There are no preventive care reminders to display for this patient.  Lab Results  Component Value Date   TSH 1.30 11/15/2019   Lab Results  Component Value Date   WBC 9.0 11/15/2019   HGB 14.7 11/15/2019   HCT 44.1 11/15/2019   MCV 93.7 11/15/2019   PLT 256.0 11/15/2019   Lab Results   Component Value Date   NA 136 01/29/2020   K 4.8 01/29/2020   CO2 27 01/29/2020   GLUCOSE 117 (H) 01/29/2020   BUN 15 01/29/2020   CREATININE 0.72 01/29/2020   BILITOT 0.5 01/29/2020   BILITOT 0.5 01/29/2020   ALKPHOS 91 01/29/2020   ALKPHOS 91 01/29/2020   AST 28 01/29/2020   AST 28 01/29/2020   ALT 27 01/29/2020   ALT 27 01/29/2020   PROT 7.3 01/29/2020   PROT 7.3 01/29/2020   ALBUMIN 4.1 01/29/2020   ALBUMIN 4.1 01/29/2020   CALCIUM 9.3 01/29/2020   GFR 101.16 01/29/2020   Lab Results  Component Value Date   CHOL 199 11/15/2019   Lab Results  Component Value Date   HDL 42.50 11/15/2019   Lab Results  Component Value Date   LDLCALC 120 (H) 11/15/2019   Lab Results  Component Value Date   TRIG 178.0 (H) 11/15/2019   Lab Results  Component Value Date   CHOLHDL 5 11/15/2019   Lab Results  Component Value Date   HGBA1C 6.8 (H) 11/15/2019      Assessment & Plan:   Problem List Items Addressed This Visit      Endocrine   Type 2 diabetes mellitus without complication, without long-term current use of insulin (HCC)   Relevant Medications   Semaglutide,0.25 or 0.5MG/DOS, (OZEMPIC, 0.25 OR 0.5 MG/DOSE,) 2 MG/1.5ML SOPN   Other Relevant Orders   Comp Met (CMET) (Completed)   Referral to Chronic Care Management Services     Other   Hyperlipidemia - Primary   Relevant Orders   Comp Met (CMET) (Completed)   Hepatic function panel (Completed)   Referral to Chronic Care Management Services  Morbid obesity ([place])   Relevant Medications   Semaglutide,0.25 or 0.5MG/DOS, (OZEMPIC, 0.25 OR 0.5 MG/DOSE,) 2 MG/1.5ML SOPN   Other Relevant Orders   Referral to Chronic Care Management Services   Nicotine use disorder   Relevant Orders   Referral to Chronic Care Management Services    Other Visit Diagnoses    Need for Tdap vaccination       Relevant Orders   Tdap vaccine greater than or equal to 7yo IM (Completed)      Meds ordered this encounter   Medications  . Vitamin D, Ergocalciferol, (DRISDOL) 1.25 MG (50000 UNIT) CAPS capsule    Sig: Take 1 capsule (50,000 Units total) by mouth every 7 (seven) days.    Dispense:  8 capsule    Refill:  0    Order Specific Question:   Supervising Provider    Answer:   Einar Pheasant [id number]  . Semaglutide,0.25 or 0.5MG/DOS, (OZEMPIC, 0.25 OR 0.5 MG/DOSE,) 2 MG/1.5ML SOPN    Sig: Inject 0.25 mg into the skin once a week. Call the office for refills.    Dispense:  1.5 mL    Refill:  1    Order Specific Question:   Supervising Provider    Answer:   Einar Pheasant [[id number]]  Please go to the lab today.   Park and chew nicotine gum explained.   We can begin Ozempic now- please go to their Web site to get the insurance saving card. I have ordered the RX and bring it in to nurse visit to learn how to give the injection.    Referral to chronic care management.   Please get the Covid , FLU, vaccine att your pharmacy  Please get the  PPSV23 - pneumovax for your nurse visit.   TDAP today.  Follow-up: Return in about 3 months (around 04/30/2020) for Nurse visit in 2 weeks to learn how to give Ozempic AND give PNA .  3 months with medical provider.   This visit occurred during the SARS-CoV-2 public health emergency.  Safety protocols were in place, including screening questions prior to the visit, additional usage of staff PPE, and extensive cleaning of exam room while observing appropriate contact time as indicated for disinfecting solutions.   Denice Paradise, NP

## 2020-01-29 NOTE — Patient Instructions (Addendum)
Please go to the lab today.   Park and chew nicotine gum explained.   We can begin Ozempic now- please go to their Web site to get the insurance saving card. I have ordered the RX and bring it in to nurse visit to learn how to give the injection.    Referral to chronic care management.   Please get the Covid , FLU, vaccine at your pharmacy  I will order the pneumococcal polysaccharide age 45-64 vaccine  at your nurse visit.   TDAP today.   Coping with Quitting Smoking  Quitting smoking is a physical and mental challenge. You will face cravings, withdrawal symptoms, and temptation. Before quitting, work with your health care provider to make a plan that can help you cope. Preparation can help you quit and keep you from giving in. How can I cope with cravings? Cravings usually last for 5-10 minutes. If you get through it, the craving will pass. Consider taking the following actions to help you cope with cravings:  Keep your mouth busy: ? Chew sugar-free gum. ? Suck on hard candies or a straw. ? Brush your teeth.  Keep your hands and body busy: ? Immediately change to a different activity when you feel a craving. ? Squeeze or play with a ball. ? Do an activity or a hobby, like making bead jewelry, practicing needlepoint, or working with wood. ? Mix up your normal routine. ? Take a short exercise break. Go for a quick walk or run up and down stairs. ? Spend time in public places where smoking is not allowed.  Focus on doing something kind or helpful for someone else.  Call a friend or family member to talk during a craving.  Join a support group.  Call a quit line, such as 1-800-QUIT-NOW.  Talk with your health care provider about medicines that might help you cope with cravings and make quitting easier for you. How can I deal with withdrawal symptoms? Your body may experience negative effects as it tries to get used to not having nicotine in the system. These effects are  called withdrawal symptoms. They may include:  Feeling hungrier than normal.  Trouble concentrating.  Irritability.  Trouble sleeping.  Feeling depressed.  Restlessness and agitation.  Craving a cigarette. To manage withdrawal symptoms:  Avoid places, people, and activities that trigger your cravings.  Remember why you want to quit.  Get plenty of sleep.  Avoid coffee and other caffeinated drinks. These may worsen some of your symptoms. How can I handle social situations? Social situations can be difficult when you are quitting smoking, especially in the first few weeks. To manage this, you can:  Avoid parties, bars, and other social situations where people might be smoking.  Avoid alcohol.  Leave right away if you have the urge to smoke.  Explain to your family and friends that you are quitting smoking. Ask for understanding and support.  Plan activities with friends or family where smoking is not an option. What are some ways I can cope with stress? Wanting to smoke may cause stress, and stress can make you want to smoke. Find ways to manage your stress. Relaxation techniques can help. For example:  Breathe slowly and deeply, in through your nose and out through your mouth.  Listen to soothing, relaxing music.  Talk with a family member or friend about your stress.  Light a candle.  Soak in a bath or take a shower.  Think about a peaceful place. What  are some ways I can prevent weight gain? Be aware that many people gain weight after they quit smoking. However, not everyone does. To keep from gaining weight, have a plan in place before you quit and stick to the plan after you quit. Your plan should include:  Having healthy snacks. When you have a craving, it may help to: ? Eat plain popcorn, crunchy carrots, celery, or other cut vegetables. ? Chew sugar-free gum.  Changing how you eat: ? Eat small portion sizes at meals. ? Eat 4-6 small meals throughout  the day instead of 1-2 large meals a day. ? Be mindful when you eat. Do not watch television or do other things that might distract you as you eat.  Exercising regularly: ? Make time to exercise each day. If you do not have time for a long workout, do short bouts of exercise for 5-10 minutes several times a day. ? Do some form of strengthening exercise, like weight lifting, and some form of aerobic exercise, like running or swimming.  Drinking plenty of water or other low-calorie or no-calorie drinks. Drink 6-8 glasses of water daily, or as much as instructed by your health care provider. Summary  Quitting smoking is a physical and mental challenge. You will face cravings, withdrawal symptoms, and temptation to smoke again. Preparation can help you as you go through these challenges.  You can cope with cravings by keeping your mouth busy (such as by chewing gum), keeping your body and hands busy, and making calls to family, friends, or a helpline for people who want to quit smoking.  You can cope with withdrawal symptoms by avoiding places where people smoke, avoiding drinks with caffeine, and getting plenty of rest.  Ask your health care provider about the different ways to prevent weight gain, avoid stress, and handle social situations. This information is not intended to replace advice given to you by your health care provider. Make sure you discuss any questions you have with your health care provider. Document Revised: 02/03/2017 Document Reviewed: 02/19/2016 Elsevier Patient Education  2020 Elsevier Inc.  Carbohydrate Counting for Diabetes Mellitus, Adult  Carbohydrate counting is a method of keeping track of how many carbohydrates you eat. Eating carbohydrates naturally increases the amount of sugar (glucose) in the blood. Counting how many carbohydrates you eat helps keep your blood glucose within normal limits, which helps you manage your diabetes (diabetes mellitus). It is  important to know how many carbohydrates you can safely have in each meal. This is different for every person. A diet and nutrition specialist (registered dietitian) can help you make a meal plan and calculate how many carbohydrates you should have at each meal and snack. Carbohydrates are found in the following foods:  Grains, such as breads and cereals.  Dried beans and soy products.  Starchy vegetables, such as potatoes, peas, and corn.  Fruit and fruit juices.  Milk and yogurt.  Sweets and snack foods, such as cake, cookies, candy, chips, and soft drinks. How do I count carbohydrates? There are two ways to count carbohydrates in food. You can use either of the methods or a combination of both. Reading "Nutrition Facts" on packaged food The "Nutrition Facts" list is included on the labels of almost all packaged foods and beverages in the [place] It includes:  The serving size.  Information about nutrients in each serving, including the grams (g) of carbohydrate per serving. To use the "Nutrition Facts":  Decide how many servings you will have.  Multiply the number of servings by the number of carbohydrates per serving.  The resulting number is the total amount of carbohydrates that you will be having. Learning standard serving sizes of other foods When you eat carbohydrate foods that are not packaged or do not include "Nutrition Facts" on the label, you need to measure the servings in order to count the amount of carbohydrates:  Measure the foods that you will eat with a food scale or measuring cup, if needed.  Decide how many standard-size servings you will eat.  Multiply the number of servings by 15. Most carbohydrate-rich foods have about 15 g of carbohydrates per serving. ? For example, if you eat 8 oz (170 g) of strawberries, you will have eaten 2 servings and 30 g of carbohydrates (2 servings x 15 g = 30 g).  For foods that have more than one food mixed, such as soups and  casseroles, you must count the carbohydrates in each food that is included. The following list contains standard serving sizes of common carbohydrate-rich foods. Each of these servings has about 15 g of carbohydrates:   hamburger bun or  English muffin.   oz (15 mL) syrup.   oz (14 g) jelly.  1 slice of bread.  1 six-inch tortilla.  3 oz (85 g) cooked rice or pasta.  4 oz (113 g) cooked dried beans.  4 oz (113 g) starchy vegetable, such as peas, corn, or potatoes.  4 oz (113 g) hot cereal.  4 oz (113 g) mashed potatoes or  of a large baked potato.  4 oz (113 g) canned or frozen fruit.  4 oz (120 mL) fruit juice.  4-6 crackers.  6 chicken nuggets.  6 oz (170 g) unsweetened dry cereal.  6 oz (170 g) plain fat-free yogurt or yogurt sweetened with artificial sweeteners.  8 oz (240 mL) milk.  8 oz (170 g) fresh fruit or one small piece of fruit.  24 oz (680 g) popped popcorn. Example of carbohydrate counting Sample meal  3 oz (85 g) chicken breast.  6 oz (170 g) brown rice.  4 oz (113 g) corn.  8 oz (240 mL) milk.  8 oz (170 g) strawberries with sugar-free whipped topping. Carbohydrate calculation 1. Identify the foods that contain carbohydrates: ? Rice. ? Corn. ? Milk. ? Strawberries. 2. Calculate how many servings you have of each food: ? 2 servings rice. ? 1 serving corn. ? 1 serving milk. ? 1 serving strawberries. 3. Multiply each number of servings by 15 g: ? 2 servings rice x 15 g = 30 g. ? 1 serving corn x 15 g = 15 g. ? 1 serving milk x 15 g = 15 g. ? 1 serving strawberries x 15 g = 15 g. 4. Add together all of the amounts to find the total grams of carbohydrates eaten: ? 30 g + 15 g + 15 g + 15 g = 75 g of carbohydrates total. Summary  Carbohydrate counting is a method of keeping track of how many carbohydrates you eat.  Eating carbohydrates naturally increases the amount of sugar (glucose) in the blood.  Counting how many  carbohydrates you eat helps keep your blood glucose within normal limits, which helps you manage your diabetes.  A diet and nutrition specialist (registered dietitian) can help you make a meal plan and calculate how many carbohydrates you should have at each meal and snack. This information is not intended to replace advice given to you by your  health care provider. Make sure you discuss any questions you have with your health care provider. Document Revised: 09/15/2016 Document Reviewed: 08/05/2015 Elsevier Patient Education  2020 [employer]

## 2020-02-02 ENCOUNTER — Other Ambulatory Visit: Payer: Self-pay | Admitting: Nurse Practitioner

## 2020-02-03 ENCOUNTER — Telehealth: Payer: Self-pay

## 2020-02-03 NOTE — Chronic Care Management (AMB) (Signed)
  Care Management   Note  02/03/2020 Name: Colleen Wagner MRN: [id number] DOB: [birth year]  Colleen Wagner is a 45 y.o. year old female who is a primary care patient of Theadore Nan, NP. I reached out to Lyman Bishop by phone today in response to a referral sent by Ms. Dorien Chihuahua Wood's PCP Amedeo Kinsman, NP.    Ms. Lucretia Roers was given information about care management services today including:  1. Care management services include personalized support from designated clinical staff supervised by her physician, including individualized plan of care and coordination with other care providers 2. 24/7 contact phone numbers for assistance for urgent and routine care needs. 3. The patient may stop care management services at any time by phone call to the office staff.  Patient agreed to services and verbal consent obtained.   Follow up plan: Telephone appointment with care management team member scheduled for:02/13/2020  Penne Lash, RMA Care Guide, Embedded Care Coordination [facility]  [place], [place] [address] Direct Dial: [phone] [email or web address] Website: [email or web address]

## 2020-02-12 ENCOUNTER — Ambulatory Visit: Payer: BC Managed Care – PPO

## 2020-02-13 ENCOUNTER — Ambulatory Visit: Payer: BC Managed Care – PPO | Admitting: Pharmacist

## 2020-02-13 DIAGNOSIS — E119 Type 2 diabetes mellitus without complications: Secondary | ICD-10-CM

## 2020-02-13 NOTE — Chronic Care Management (AMB) (Signed)
  Care Management   Pharmacy Note  02/13/2020 Name: Colleen Wagner MRN: [id number] DOB: 04/20/1975   Colleen Wagner is a 44 y.o. year old female who is a primary care patient of Theadore Nan, NP. The Care Management/Care Coordination team team was consulted for assistance with care management and care coordination needs.    Contacted patient today to discuss addition of Ozempic. Patient notes that she has not started taking this medication yet, as she was instructed she needed a nurse visit for education.   Verbally reviewed Ozempic administration, side effects, benefits. Reviewed goal A1c, goal fasting, and goal 2 hour post prandial glucose.   Patient asks for upcoming RN visit for Ozempic teaching to be canceled, and notes that she will get the pneumonia shot at her pharmacy. I have canceled this visit.   Patient declines continued follow up and will call with any questions or concerns prior to establishing with a new PCP.   Catie Feliz Beam, PharmD, [place], CPP Clinical Pharmacist [facility] [employer] [phone]

## 2020-02-19 ENCOUNTER — Ambulatory Visit: Payer: BC Managed Care – PPO

## 2020-03-02 ENCOUNTER — Ambulatory Visit: Payer: BC Managed Care – PPO | Admitting: Nurse Practitioner

## 2020-03-28 ENCOUNTER — Encounter (INDEPENDENT_AMBULATORY_CARE_PROVIDER_SITE_OTHER): Payer: Self-pay

## 2020-04-07 ENCOUNTER — Telehealth: Payer: Self-pay | Admitting: Nurse Practitioner

## 2020-04-07 MED ORDER — METFORMIN HCL ER 500 MG PO TB24: ORAL_TABLET | ORAL | 1 refills | Status: DC

## 2020-04-07 NOTE — Telephone Encounter (Signed)
Reviewed chart.  Per 11/29/19 note, metformin increased to bid.  Ok to send in rx for bid metformin as she is taking.  Agree with need for f/u and labs.

## 2020-04-07 NOTE — Telephone Encounter (Signed)
Pt needs a refill on metFORMIN (GLUCOPHAGE XR) 500 MG 24 hr tablet.. she said that Selena Batten changed it to twice daily instead of once a day so that is why she needs the refill She has a TOC appt scheduled with Dr.Scott in March

## 2020-04-07 NOTE — Addendum Note (Signed)
Addended by: Elise Benne T on: 04/07/2020 01:30 PM   Modules accepted: Orders

## 2020-05-21 ENCOUNTER — Encounter: Payer: Self-pay | Admitting: Internal Medicine

## 2020-05-21 ENCOUNTER — Other Ambulatory Visit: Payer: Self-pay

## 2020-05-21 ENCOUNTER — Ambulatory Visit: Payer: BC Managed Care – PPO | Admitting: Internal Medicine

## 2020-05-21 VITALS — BP 112/82 | HR 71 | Temp 98.3°F | Ht 68.0 in | Wt 250.8 lb

## 2020-05-21 DIAGNOSIS — E785 Hyperlipidemia, unspecified: Secondary | ICD-10-CM

## 2020-05-21 DIAGNOSIS — B977 Papillomavirus as the cause of diseases classified elsewhere: Secondary | ICD-10-CM

## 2020-05-21 DIAGNOSIS — Z720 Tobacco use: Secondary | ICD-10-CM

## 2020-05-21 DIAGNOSIS — E119 Type 2 diabetes mellitus without complications: Secondary | ICD-10-CM

## 2020-05-21 DIAGNOSIS — Z1211 Encounter for screening for malignant neoplasm of colon: Secondary | ICD-10-CM

## 2020-05-21 DIAGNOSIS — Z1231 Encounter for screening mammogram for malignant neoplasm of breast: Secondary | ICD-10-CM

## 2020-05-21 LAB — BASIC METABOLIC PANEL
BUN: 11 mg/dL (ref 6–23)
CO2: 28 mEq/L (ref 19–32)
Calcium: 9.2 mg/dL (ref 8.4–10.5)
Chloride: 102 mEq/L (ref 96–112)
Creatinine, Ser: 0.72 mg/dL (ref 0.40–1.20)
GFR: 100.94 mL/min (ref >60.00)
Glucose, Bld: 112 mg/dL — ABNORMAL HIGH (ref 70–99)
Potassium: 3.8 mEq/L (ref 3.5–5.1)
Sodium: 138 mEq/L (ref 135–145)

## 2020-05-21 LAB — LIPID PANEL
Cholesterol: 128 mg/dL (ref 0–200)
HDL: 43 mg/dL (ref >39.00)
LDL Cholesterol: 55 mg/dL (ref 0–99)
NonHDL: 84.62
Total CHOL/HDL Ratio: 3
Triglycerides: 150 mg/dL — ABNORMAL HIGH (ref 0.0–149.0)
VLDL: 30 mg/dL (ref 0.0–40.0)

## 2020-05-21 LAB — HEPATIC FUNCTION PANEL
ALT: 13 U/L (ref 0–35)
AST: 15 U/L (ref 0–37)
Albumin: 4 g/dL (ref 3.5–5.2)
Alkaline Phosphatase: 100 U/L (ref 39–117)
Bilirubin, Direct: 0.1 mg/dL (ref 0.0–0.3)
Total Bilirubin: 0.4 mg/dL (ref 0.2–1.2)
Total Protein: 7.7 g/dL (ref 6.0–8.3)

## 2020-05-21 LAB — HEMOGLOBIN A1C: Hgb A1c MFr Bld: 6.8 % — ABNORMAL HIGH (ref 4.6–6.5)

## 2020-05-21 NOTE — Progress Notes (Signed)
Patient ID: Colleen Wagner, female   DOB: 1975/12/27, 45 y.o.   MRN: [id number]   Subjective:    Patient ID: Colleen Wagner, female    DOB: [birth year], 45 y.o.   MRN: [id number]  HPI This visit occurred during the SARS-CoV-2 public health emergency.  Safety protocols were in place, including screening questions prior to the visit, additional usage of staff PPE, and extensive cleaning of exam room while observing appropriate contact time as indicated for disinfecting solutions.  Patient here to establish care.  Previously saw Dawson Bills.  Has a history of diabetes and hypercholesterolemia.  Taking ozempic and metformin.  AM sugars - 120s.  Low carb diet and exercise.  Increased stress.  Son in [place] 02/2020.  Still in hospital.  Has good support.  Does not feel needs anything more at this time.  Tries to stay active.  No chest pain or sob reported.  Breathing stable.  Acid reflux controlled on omeprazole.  No abdominal pain.  Bowels moving.  Has mirena.  Due to change this year.  Followed by [facility] GYN.  Does smoke - one pack qod.    Past Medical History:  Diagnosis Date  . Chicken pox   . Diabetes mellitus without complication [facility])    Past Surgical History:  Procedure Laterality Date  . GALLBLADDER SURGERY     Family History  Problem Relation Age of Onset  . Hyperlipidemia Mother   . Hypertension Father   . Diabetes Father   . Stroke Father   . Miscarriages / Stillbirths Sister   . Arthritis Paternal Grandmother   . Diabetes Paternal Grandmother   . Breast cancer Neg Hx    Social History   Socioeconomic History  . Marital status: Divorced    Spouse name: Not on file  . Number of children: Not on file  . Years of education: Not on file  . Highest education level: Bachelor's degree (e.g., BA, AB, BS)  Occupational History  . Not on file  Tobacco Use  . Smoking status: Current Every Day Smoker    Packs/day: 0.50    Years: 25.00    Pack years: 12.50    Types: Cigarettes  . Smokeless  tobacco: Never Used  . Tobacco comment: Enjoys smoking  Vaping Use  . Vaping Use: Former  Substance and Sexual Activity  . Alcohol use: Yes    Comment: rarely  . Drug use: Never  . Sexual activity: Yes    Birth control/protection: I.U.D.  Other Topics Concern  . Not on file  Social History Narrative   No married with 2 sons and works [employer] Conservation officer, historic buildings   Social Determinants of Radio broadcast assistant Strain: Not on file  Food Insecurity: Not on file  Transportation Needs: Not on file  Physical Activity: Not on file  Stress: Not on file  Social Connections: Not on file    Outpatient Encounter Medications as of 05/21/2020  Medication Sig  . blood glucose meter kit and supplies Dispense based on patient and insurance preference. Use up to four times daily as directed. (FOR ICD-10 E10.9, E11.9).  Marland Kitchen levonorgestrel (MIRENA) 20 MCG/24HR IUD by Intrauterine route.  . metFORMIN (GLUCOPHAGE XR) 500 MG 24 hr tablet Start 511m PO BID  . rosuvastatin (CRESTOR) 5 MG tablet Take 1 tablet (5 mg total) by mouth daily.  . Semaglutide,0.25 or 0.5MG/DOS, (OZEMPIC, 0.25 OR 0.5 MG/DOSE,) 2 MG/1.5ML SOPN Inject 0.25 mg into the skin once a week. Call the office for  refills.  . [DISCONTINUED] Vitamin D, Ergocalciferol, (DRISDOL) 1.25 MG (50000 UNIT) CAPS capsule Take 1 capsule (50,000 Units total) by mouth every 7 (seven) days. (Patient not taking: Reported on 05/21/2020)   No facility-administered encounter medications on file as of 05/21/2020.   Review of Systems  Constitutional: Negative for appetite change and unexpected weight change.  HENT: Negative for congestion and sinus pressure.   Respiratory: Negative for cough, chest tightness and shortness of breath.   Cardiovascular: Negative for chest pain, palpitations and leg swelling.  Gastrointestinal: Negative for abdominal pain, diarrhea, nausea and vomiting.  Genitourinary: Negative for difficulty urinating and dysuria.  Musculoskeletal:  Negative for joint swelling and myalgias.  Skin: Negative for color change and rash.  Neurological: Negative for dizziness, light-headedness and headaches.  Psychiatric/Behavioral: Negative for agitation and dysphoric mood.       Objective:    Physical Exam Vitals reviewed.  Constitutional:      General: She is not in acute distress.    Appearance: Normal appearance.  HENT:     Head: Normocephalic and atraumatic.     Right Ear: External ear normal.     Left Ear: External ear normal.  Eyes:     General: No scleral icterus.       Right eye: No discharge.        Left eye: No discharge.     Conjunctiva/sclera: Conjunctivae normal.  Neck:     Thyroid: No thyromegaly.  Cardiovascular:     Rate and Rhythm: Normal rate and regular rhythm.  Pulmonary:     Effort: No respiratory distress.     Breath sounds: Normal breath sounds. No wheezing.  Abdominal:     General: Bowel sounds are normal.     Palpations: Abdomen is soft.     Tenderness: There is no abdominal tenderness.  Musculoskeletal:        General: No swelling or tenderness.     Cervical back: Neck supple. No tenderness.  Lymphadenopathy:     Cervical: No cervical adenopathy.  Skin:    Findings: No erythema or rash.  Neurological:     Mental Status: She is alert.  Psychiatric:        Mood and Affect: Mood normal.        Behavior: Behavior normal.     BP 112/82 (BP Location: Left Arm, Patient Position: Sitting, Cuff Size: Large)   Pulse 71   Temp 98.3 F (36.8 C)   Ht '5\' 8"'  (1.727 m)   Wt 250 lb 12.8 oz (113.8 kg)   SpO2 97%   BMI 38.13 kg/m  Wt Readings from Last 3 Encounters:  05/21/20 250 lb 12.8 oz (113.8 kg)  01/29/20 252 lb (114.3 kg)  11/29/19 254 lb (115.2 kg)     Lab Results  Component Value Date   WBC 9.0 11/15/2019   HGB 14.7 11/15/2019   HCT 44.1 11/15/2019   PLT 256.0 11/15/2019   GLUCOSE 112 (H) 05/21/2020   CHOL 128 05/21/2020   TRIG 150.0 (H) 05/21/2020   HDL 43.00 05/21/2020    LDLCALC 55 05/21/2020   ALT 13 05/21/2020   AST 15 05/21/2020   NA 138 05/21/2020   K 3.8 05/21/2020   CL 102 05/21/2020   CREATININE 0.72 05/21/2020   BUN 11 05/21/2020   CO2 28 05/21/2020   TSH 1.30 11/15/2019   HGBA1C 6.8 (H) 05/21/2020   MICROALBUR 1.1 11/15/2019    MM 3D SCREEN BREAST BILATERAL  Result Date: 08/15/2019 CLINICAL DATA:  Screening. EXAM: DIGITAL SCREENING BILATERAL MAMMOGRAM WITH TOMO AND CAD COMPARISON:  Previous exam(s). ACR Breast Density Category a: The breast tissue is almost entirely fatty. FINDINGS: There are no findings suspicious for malignancy. Images were processed with CAD. IMPRESSION: No mammographic evidence of malignancy. A result letter of this screening mammogram will be mailed directly to the patient. RECOMMENDATION: Screening mammogram in one year. (Code:SM-B-01Y) BI-RADS CATEGORY  1: Negative. Electronically Signed   By: Ammie Ferrier M.D.   On: 08/15/2019 14:21       Assessment & Plan:   Problem List Items Addressed This Visit    Breast cancer screening    Mammogram 08/15/19 - Birads I.       Colon cancer screening    Age 35.  Due screening colonoscopy.  Will notify me when agreeable.       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       Hyperlipidemia    Low cholesterol diet and exercise.  On crestor.  Follow lipid panel and liver function tests.       Relevant Orders   Lipid panel (Completed)   Hepatic function panel (Completed)   Tobacco use    Smokes.  Increased stress.  Not ready to quit. Follow.       Type 2 diabetes mellitus without complication, without long-term current use of insulin (HCC) - Primary    Continue ozempic and metformin.  Sugars as outlined.  Low carb diet and exercise.  Follow met b and a1c. Check today.       Relevant Orders   Hemoglobin A1c (Completed)   Basic metabolic panel (Completed)       Einar Pheasant, MD

## 2020-05-30 ENCOUNTER — Encounter: Payer: Self-pay | Admitting: Internal Medicine

## 2020-05-30 DIAGNOSIS — Z1239 Encounter for other screening for malignant neoplasm of breast: Secondary | ICD-10-CM | POA: Insufficient documentation

## 2020-05-30 DIAGNOSIS — Z1211 Encounter for screening for malignant neoplasm of colon: Secondary | ICD-10-CM | POA: Insufficient documentation

## 2020-05-30 NOTE — Assessment & Plan Note (Signed)
Low cholesterol diet and exercise.  On crestor.  Follow lipid panel and liver function tests.  

## 2020-05-30 NOTE — Assessment & Plan Note (Signed)
Age 45.  Due screening colonoscopy.  Will notify me when agreeable.

## 2020-05-30 NOTE — Assessment & Plan Note (Signed)
Continue ozempic and metformin.  Sugars as outlined.  Low carb diet and exercise.  Follow met b and a1c. Check today.  

## 2020-05-30 NOTE — Assessment & Plan Note (Signed)
Mammogram 08/15/19 - Birads I.

## 2020-05-30 NOTE — Assessment & Plan Note (Signed)
Followed by gyn

## 2020-05-30 NOTE — Assessment & Plan Note (Addendum)
Smokes.  Increased stress.  Not ready to quit. Follow.

## 2020-06-12 LAB — HM DIABETES EYE EXAM

## 2020-07-05 ENCOUNTER — Encounter: Payer: Self-pay | Admitting: Internal Medicine

## 2020-07-06 ENCOUNTER — Telehealth: Payer: Self-pay

## 2020-07-06 NOTE — Telephone Encounter (Signed)
Please call her and see if cost is an issue for ozempic.  The injections would be the same type of medication as ozempic.  Also the weight loss medication - wegovy - same type of medication as ozempic.  Just wanted to clarify.

## 2020-07-06 NOTE — Chronic Care Management (AMB) (Signed)
  Chronic Care Management   Note  07/06/2020 Name: BREXLEY CUTSHAW MRN: [id number] DOB: 1975-11-23  SALEEN PEDEN is a 45 y.o. year old female who is a primary care patient of Dale Fifty-Six, MD. MAYBELL MISENHEIMER is currently enrolled in care management services. An additional referral for Pharm D  was placed.   Follow up plan: Telephone appointment with care management team member scheduled for:07/16/2020  Penne Lash, RMA Care Guide, Embedded Care Coordination [facility]  [place], [place] [address] Direct Dial: [phone] [email or web address] Website: [email or web address]

## 2020-07-06 NOTE — Telephone Encounter (Signed)
Cost was not an issue. She started having increased heartburn everyday when starting the ozempic causing her to use prilosec everyday. She is agreeable to see/talk with Catie. She misunderstood when they were trying to schedule her the first time.

## 2020-07-16 ENCOUNTER — Ambulatory Visit: Payer: BC Managed Care – PPO | Admitting: Pharmacist

## 2020-07-16 DIAGNOSIS — E119 Type 2 diabetes mellitus without complications: Secondary | ICD-10-CM

## 2020-07-16 MED ORDER — OZEMPIC (0.25 OR 0.5 MG/DOSE) 2 MG/1.5ML ~~LOC~~ SOPN: 0.2500 mg | PEN_INJECTOR | SUBCUTANEOUS | 0 refills | Status: DC

## 2020-07-16 NOTE — Patient Instructions (Signed)
Food Choices for Gastroesophageal Reflux Disease, Adult When you have gastroesophageal reflux disease (GERD), the foods you eat and your eating habits are very important. Choosing the right foods can help ease your discomfort. Think about working with a food expert (dietitian) to help you make good choices. What are tips for following this plan? Reading food labels  Look for foods that are low in saturated fat. Foods that may help with your symptoms include: ? Foods that have less than 5% of daily value (DV) of fat. ? Foods that have 0 grams of trans fat. Cooking  Do not fry your food.  Cook your food by baking, steaming, grilling, or broiling. These are all methods that do not need a lot of fat for cooking.  To add flavor, try to use herbs that are low in spice and acidity. Meal planning  Choose healthy foods that are low in fat, such as: ? Fruits and vegetables. ? Whole grains. ? Low-fat dairy products. ? Lean meats, fish, and poultry.  Eat small meals often instead of eating 3 large meals each day. Eat your meals slowly in a place where you are relaxed. Avoid bending over or lying down until 2-3 hours after eating.  Limit high-fat foods such as fatty meats or fried foods.  Limit your intake of fatty foods, such as oils, butter, and shortening.  Avoid the following as told by your doctor: ? Foods that cause symptoms. These may be different for different people. Keep a food diary to keep track of foods that cause symptoms. ? Alcohol. ? Drinking a lot of liquid with meals. ? Eating meals during the 2-3 hours before bed.   Lifestyle  Stay at a healthy weight. Ask your doctor what weight is healthy for you. If you need to lose weight, work with your doctor to do so safely.  Exercise for at least 30 minutes on 5 or more days each week, or as told by your doctor.  Wear loose-fitting clothes.  Do not smoke or use any products that contain nicotine or tobacco. If you need  help quitting, ask your doctor.  Sleep with the head of your bed higher than your feet. Use a wedge under the mattress or blocks under the bed frame to raise the head of the bed.  Chew sugar-free gum after meals. What foods should eat? Eat a healthy, well-balanced diet of fruits, vegetables, whole grains, low-fat dairy products, lean meats, fish, and poultry. Each person is different. Foods that may cause symptoms in one person may not cause any symptoms in another person. Work with your doctor to find foods that are safe for you. The items listed above may not be a complete list of what you can eat and drink. Contact a food expert for more options.   What foods should I avoid? Limiting some of these foods may help in managing the symptoms of GERD. Everyone is different. Talk with a food expert or your doctor to help you find the exact foods to avoid, if any. Fruits Any fruits prepared with added fat. Any fruits that cause symptoms. For some people, this may include citrus fruits, such as oranges, grapefruit, pineapple, and lemons. Vegetables Deep-fried vegetables. [place] fries. Any vegetables prepared with added fat. Any vegetables that cause symptoms. For some people, this may include tomatoes and tomato products, chili peppers, onions and garlic, and horseradish. Grains Pastries or quick breads with added fat. Meats and other proteins High-fat meats, such as fatty beef  or pork, hot dogs, ribs, ham, sausage, salami, and bacon. Fried meat or protein, including fried fish and fried chicken. Nuts and nut butters, in large amounts. Dairy Whole milk and chocolate milk. Sour cream. Cream. Ice cream. Cream cheese. Milkshakes. Fats and oils Butter. Margarine. Shortening. Ghee. Beverages Coffee and tea, with or without caffeine. Carbonated beverages. Sodas. Energy drinks. Fruit juice made with acidic fruits, such as orange or grapefruit. Tomato juice. Alcoholic drinks. Sweets and  desserts Chocolate and cocoa. Donuts. Seasonings and condiments Pepper. Peppermint and spearmint. Added salt. Any condiments, herbs, or seasonings that cause symptoms. For some people, this may include curry, hot sauce, or vinegar-based salad dressings. The items listed above may not be a complete list of what you should not eat and drink. Contact a food expert for more options. Questions to ask your doctor Diet and lifestyle changes are often the first steps that are taken to manage symptoms of GERD. If diet and lifestyle changes do not help, talk with your doctor about taking medicines. Where to find more information  International Foundation for Gastrointestinal Disorders: aboutgerd.org Summary  When you have GERD, food and lifestyle choices are very important in easing your symptoms.  Eat small meals often instead of 3 large meals a day. Eat your meals slowly and in a place where you are relaxed.  Avoid bending over or lying down until 2-3 hours after eating.  Limit high-fat foods such as fatty meats or fried foods. This information is not intended to replace advice given to you by your health care provider. Make sure you discuss any questions you have with your health care provider. Document Revised: 09/02/2019 Document Reviewed: 09/02/2019 Elsevier Patient Education  2021 Elsevier Inc. Visit Information  PATIENT GOALS:  Goals Addressed              This Visit's Progress     Patient Stated   .  Disease Progression Prevention (pt-stated)         Patient Goals/Self-Care Activities . Over the next 90 days, patient will:  - take medications as prescribed engage in dietary modifications by reducing consumption of trigger foods       [name] was given information about Care Management services by the embedded care coordination team including:  1. Care Management services include personalized support from designated clinical staff supervised by her physician, including  individualized plan of care and coordination with other care providers 2. 24/7 contact phone numbers for assistance for urgent and routine care needs. 3. The patient may stop CCM services at any time (effective at the end of the month) by phone call to the office staff.  Patient agreed to services and verbal consent obtained.   Patient verbalizes understanding of instructions provided today and agrees to view in MyChart.    Plan: Telephone follow up appointment with care management team member scheduled for:  ~ 6 weeks  Catie Feliz Beam, PharmD, [place], CPP Clinical Pharmacist [facility] at [facility] [phone]

## 2020-07-16 NOTE — Chronic Care Management (AMB) (Signed)
Care Management   Pharmacy Note  07/16/2020 Name: Colleen Wagner MRN: [id number] DOB: 1975-09-10  Subjective: Colleen Wagner is a 45 y.o. year old female who is a primary care patient of Einar Pheasant, MD. The Care Management team was consulted for assistance with care management and care coordination needs.    Engaged with patient by telephone for initial visit in response to provider referral for pharmacy case management and/or care coordination services.   The patient was given information about Care Management services today including:  1. Care Management services includes personalized support from designated clinical staff supervised by the patient's primary care provider, including individualized plan of care and coordination with other care providers. 2. 24/7 contact phone numbers for assistance for urgent and routine care needs. 3. The patient may stop case management services at any time by phone call to the office staff.  Patient agreed to services and consent obtained.  Assessment:  Review of patient status, including review of consultants reports, laboratory and other test data, was performed as part of comprehensive evaluation and provision of chronic care management services.   SDOH (Social Determinants of Health) assessments and interventions performed:    Objective:  Lab Results  Component Value Date   CREATININE 0.72 05/21/2020   CREATININE 0.72 01/29/2020   CREATININE 0.77 11/15/2019    Lab Results  Component Value Date   HGBA1C 6.8 (H) 05/21/2020       Component Value Date/Time   CHOL 128 05/21/2020 1157   TRIG 150.0 (H) 05/21/2020 1157   HDL 43.00 05/21/2020 1157   CHOLHDL 3 05/21/2020 1157   VLDL 30.0 05/21/2020 1157   LDLCALC 55 05/21/2020 1157     Clinical ASCVD: No    BP Readings from Last 3 Encounters:  05/21/20 112/82  01/29/20 122/88  11/08/19 120/86    Care Plan  No Known Allergies  Medications Reviewed Today    Reviewed by De Hollingshead, RPH-CPP (Pharmacist) on 07/16/20 at [place] [facility] Status: <None>  Medication Order Taking? Sig Documenting Provider Last Dose Status Informant  blood glucose meter kit and supplies [id number]  Dispense based on patient and insurance preference. Use up to four times daily as directed. (FOR ICD-10 E10.9, E11.9). Marval Regal, NP  Active   levonorgestrel (MIRENA) 20 MCG/24HR IUD [id number] Yes by Intrauterine route. [provider] Taking Active   metFORMIN (GLUCOPHAGE XR) 500 MG 24 hr tablet [id number] Yes Start 557m PO BID SEinar Pheasant MD Taking Active   omeprazole (PRILOSEC) 20 MG capsule [id number] Take 20 mg by mouth daily. [provider] Taking Active   rosuvastatin (CRESTOR) 5 MG tablet [id number] Take 1 tablet (5 mg total) by mouth daily. MMarval Regal NP Taking Active   Semaglutide,0.25 or 0.5MG/DOS, (OZEMPIC, 0.25 OR 0.5 MG/DOSE,) 2 MG/1.5ML SOPN [id number] Inject 0.25 mg into the skin once a week. Call the office for refills. MMarval Regal NP Taking Active           Patient Active Problem List   Diagnosis Date Noted  . Breast cancer screening 05/30/2020  . Colon cancer screening 05/30/2020  . Nicotine use disorder 11/29/2019  . Menorrhagia with irregular cycle 11/08/2019  . Hyperlipidemia 11/08/2019  . Tobacco use 11/08/2019  . Chronic right shoulder pain 11/08/2019  . Acquired deformity of left foot 11/08/2019  . Type 2 diabetes mellitus without complication, without long-term current use of insulin ([place] 08/14/2019  . 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 Unable to achieve  control of weight   Pharmacist Clinical Goal(s):  . Over the next 90 days, patient will achieve improvement in weight through collaboration with PharmD and provider.   Interventions: . 1:1 collaboration with Scott, Charlene, MD regarding development and update of comprehensive plan of care as evidenced by provider attestation and co-signature . Inter-disciplinary care team collaboration (see longitudinal plan of care) . Comprehensive medication review performed; medication list updated in electronic medical record  Diabetes: . Controlled; current treatment: metformin 500 mg BID, Ozempic 0.25 mg weekly - ran out of medication, but notes that she was on 0.25 mg for at least 4 months (since December).  . Reports acid reflux symptoms after breakfast when she was on Ozempic, requiring daily use of OTC omeprazole.   . Current meal patterns: breakfast: bacon, egg, cheese sanwich, fried liver pudding sandwich; 1 pm lunch: leftovers from cooking dinner, take out; no supper;  . Current exercise: 30 minutes 3x times weekly- 2 months; . Discussed dietary modifications that may improve acid reflux symptoms. Patient's breakfast is heavy, fatty, and likely to cause acid reflux that may be prolonged by delayed gastric emptying with GLP1.  . Discussed that it is inappropriate long term to need to rely on using PPI to treat symptoms exacerbated by another medication. Discussed possible long term complications of PPI use.  . Discussed that if symptoms can resolve with dietary modifications + PRN H2RA, such as famotidine, it could be appropriate to continue Ozempic therapy to target improved weight loss, that could also have positive impact on GERD symptoms. Patient verbalized understanding. Will send script for Ozempic 0.25 mg weekly x 1 pen with no refills. Will follow up in 6 weeks regarding symptoms.   Hyperlipidemia: . Controlled; current treatment: rosuvastatin 5 mg  . Continue current regimen at this  time  Contraception: . Appropriately managed; current regimen: Mirena IUD . Continue current regimen at this time   Patient Goals/Self-Care Activities . Over the next 90 days, patient will:  - take medications as prescribed engage in dietary modifications by reducing consumption of trigger foods  Follow Up Plan: Telephone follow up appointment with care management team member scheduled for: ~ 6 weeks      Medication Assistance:  None required.  Patient affirms current coverage meets needs.  Follow Up:  Patient agrees to Care Plan and Follow-up.  Plan: Telephone follow up appointment with care management team member scheduled for:  ~ 6 weeks  Catie Travis, PharmD, BCACP, CPP Clinical Pharmacist [facility] at [place] [phone]   

## 2020-09-03 ENCOUNTER — Ambulatory Visit: Payer: BC Managed Care – PPO

## 2020-09-03 ENCOUNTER — Encounter: Payer: Self-pay | Admitting: Internal Medicine

## 2020-09-03 ENCOUNTER — Other Ambulatory Visit: Payer: Self-pay

## 2020-09-03 ENCOUNTER — Ambulatory Visit: Payer: BC Managed Care – PPO | Admitting: Internal Medicine

## 2020-09-03 DIAGNOSIS — F439 Reaction to severe stress, unspecified: Secondary | ICD-10-CM

## 2020-09-03 DIAGNOSIS — E119 Type 2 diabetes mellitus without complications: Secondary | ICD-10-CM

## 2020-09-03 DIAGNOSIS — Z720 Tobacco use: Secondary | ICD-10-CM | POA: Diagnosis not present

## 2020-09-03 DIAGNOSIS — B977 Papillomavirus as the cause of diseases classified elsewhere: Secondary | ICD-10-CM

## 2020-09-03 DIAGNOSIS — Z1231 Encounter for screening mammogram for malignant neoplasm of breast: Secondary | ICD-10-CM

## 2020-09-03 DIAGNOSIS — E785 Hyperlipidemia, unspecified: Secondary | ICD-10-CM

## 2020-09-03 DIAGNOSIS — Z1211 Encounter for screening for malignant neoplasm of colon: Secondary | ICD-10-CM

## 2020-09-03 LAB — LIPID PANEL
Cholesterol: 158 mg/dL (ref 0–200)
HDL: 40.6 mg/dL (ref >39.00)
LDL Cholesterol: 81 mg/dL (ref 0–99)
NonHDL: 117.29
Total CHOL/HDL Ratio: 4
Triglycerides: 179 mg/dL — ABNORMAL HIGH (ref 0.0–149.0)
VLDL: 35.8 mg/dL (ref 0.0–40.0)

## 2020-09-03 LAB — HEPATIC FUNCTION PANEL
ALT: 13 U/L (ref 0–35)
AST: 15 U/L (ref 0–37)
Albumin: 4 g/dL (ref 3.5–5.2)
Alkaline Phosphatase: 100 U/L (ref 39–117)
Bilirubin, Direct: 0.1 mg/dL (ref 0.0–0.3)
Total Bilirubin: 0.4 mg/dL (ref 0.2–1.2)
Total Protein: 7.6 g/dL (ref 6.0–8.3)

## 2020-09-03 LAB — BASIC METABOLIC PANEL
BUN: 15 mg/dL (ref 6–23)
CO2: 26 mEq/L (ref 19–32)
Calcium: 9.1 mg/dL (ref 8.4–10.5)
Chloride: 104 mEq/L (ref 96–112)
Creatinine, Ser: 0.84 mg/dL (ref 0.40–1.20)
GFR: 83.73 mL/min (ref >60.00)
Glucose, Bld: 131 mg/dL — ABNORMAL HIGH (ref 70–99)
Potassium: 4.2 mEq/L (ref 3.5–5.1)
Sodium: 137 mEq/L (ref 135–145)

## 2020-09-03 LAB — HEMOGLOBIN A1C: Hgb A1c MFr Bld: 7 % — ABNORMAL HIGH (ref 4.6–6.5)

## 2020-09-03 NOTE — Progress Notes (Signed)
Patient ID: Colleen Wagner, female   DOB: Oct 01, 1975, 45 y.o.   MRN: [id number]   Subjective:    Patient ID: Colleen Wagner, female    DOB: 08-Oct-1975, 45 y.o.   MRN: [id number]  HPI This visit occurred during the SARS-CoV-2 public health emergency.  Safety protocols were in place, including screening questions prior to the visit, additional usage of staff PPE, and extensive cleaning of exam room while observing appropriate contact time as indicated for disinfecting solutions.   Patient here for a scheduled follow up.  Here today to follow-up on her diabetes and cholesterol.  She is taking Ozempic and metformin.  Was having increased acid reflux on Ozempic.  This is not a problem for her now.   She is off Prilosec.  Doing well off the medication.  Has joined a gym.  Has lost some weight.  No chest pain or shortness of breath with increased activity or exertion.  No nausea or vomiting.  No abdominal pain.  Bowels moving.  She is handling stress.  Son is at rehab in [place].  He is going to get to come home next week.  He is doing better.  Discussed colon cancer screening.  She is due for colonoscopy since she is 33.  Will let me know when she is ready to have this done.  She will schedule her GYN appointment to have her IUD removed and for her pelvic exam.  Due mammogram.  Past Medical History:  Diagnosis Date   Chicken pox    Diabetes mellitus without complication ([place])    Past Surgical History:  Procedure Laterality Date   GALLBLADDER SURGERY     Family History  Problem Relation Age of Onset   Hyperlipidemia Mother    Hypertension Father    Diabetes Father    Stroke Father    Miscarriages / [place] Sister    Arthritis Paternal Grandmother    Diabetes Paternal Grandmother    Breast cancer Neg Hx    Social History   Socioeconomic History   Marital status: Divorced    Spouse name: Not on file   Number of children: Not on file   Years of education: Not on file   Highest education  level: Bachelor's degree (e.g., BA, AB, BS)  Occupational History   Not on file  Tobacco Use   Smoking status: Every Day    Packs/day: 0.50    Years: 25.00    Pack years: 12.50    Types: Cigarettes   Smokeless tobacco: Never   Tobacco comments:    Enjoys smoking  Vaping Use   Vaping Use: Former  Substance and Sexual Activity   Alcohol use: Yes    Comment: rarely   Drug use: Never   Sexual activity: Yes    Birth control/protection: I.U.D.  Other Topics Concern   Not on file  Social History Narrative   No married with 2 sons and works [employer] Conservation officer, historic buildings   Social Determinants of Radio broadcast assistant Strain: Not on file  Food Insecurity: Not on file  Transportation Needs: Not on file  Physical Activity: Not on file  Stress: Not on file  Social Connections: Not on file    Review of Systems  Constitutional:  Negative for appetite change and unexpected weight change.  HENT:  Negative for sinus pressure.   Respiratory:  Negative for cough, chest tightness and shortness of breath.   Cardiovascular:  Negative for chest pain and palpitations.  Gastrointestinal:  Negative  for abdominal pain, diarrhea, nausea and vomiting.  Genitourinary:  Negative for difficulty urinating and dysuria.  Musculoskeletal:  Negative for joint swelling and myalgias.  Skin:  Negative for color change and rash.  Neurological:  Negative for dizziness, light-headedness and headaches.  Psychiatric/Behavioral:  Negative for agitation and dysphoric mood.       Objective:    Physical Exam Vitals reviewed.  Constitutional:      General: She is not in acute distress.    Appearance: Normal appearance.  HENT:     Head: Normocephalic and atraumatic.     Right Ear: External ear normal.     Left Ear: External ear normal.  Eyes:     General: No scleral icterus.       Right eye: No discharge.        Left eye: No discharge.     Conjunctiva/sclera: Conjunctivae normal.  Neck:     Thyroid: No  thyromegaly.  Cardiovascular:     Rate and Rhythm: Normal rate and regular rhythm.  Pulmonary:     Effort: No respiratory distress.     Breath sounds: Normal breath sounds. No wheezing.  Abdominal:     General: Bowel sounds are normal.     Palpations: Abdomen is soft.     Tenderness: There is no abdominal tenderness.  Musculoskeletal:        General: No swelling or tenderness.     Cervical back: Neck supple. No tenderness.  Lymphadenopathy:     Cervical: No cervical adenopathy.  Skin:    Findings: No erythema or rash.  Neurological:     Mental Status: She is alert.  Psychiatric:        Mood and Affect: Mood normal.        Behavior: Behavior normal.    BP (!) 132/94   Pulse 79   Temp 98.3 F (36.8 C)   Ht 5' 7.99" (1.727 m)   Wt 247 lb (112 kg)   SpO2 98%   BMI 37.57 kg/m  Wt Readings from Last 3 Encounters:  09/03/20 247 lb (112 kg)  05/21/20 250 lb 12.8 oz (113.8 kg)  01/29/20 252 lb (114.3 kg)    Outpatient Encounter Medications as of 09/03/2020  Medication Sig   blood glucose meter kit and supplies Dispense based on patient and insurance preference. Use up to four times daily as directed. (FOR ICD-10 E10.9, E11.9).   levonorgestrel (MIRENA) 20 MCG/24HR IUD by Intrauterine route.   metFORMIN (GLUCOPHAGE XR) 500 MG 24 hr tablet Start 535m PO BID   rosuvastatin (CRESTOR) 5 MG tablet Take 1 tablet (5 mg total) by mouth daily.   Semaglutide,0.25 or 0.5MG/DOS, (OZEMPIC, 0.25 OR 0.5 MG/DOSE,) 2 MG/1.5ML SOPN Inject 0.25 mg into the skin once a week.   [DISCONTINUED] omeprazole (PRILOSEC) 20 MG capsule Take 20 mg by mouth daily. (Patient not taking: Reported on 09/03/2020)   No facility-administered encounter medications on file as of 09/03/2020.     Lab Results  Component Value Date   WBC 9.0 11/15/2019   HGB 14.7 11/15/2019   HCT 44.1 11/15/2019   PLT 256.0 11/15/2019   GLUCOSE 131 (H) 09/03/2020   CHOL 158 09/03/2020   TRIG 179.0 (H) 09/03/2020   HDL 40.60  09/03/2020   LDLCALC 81 09/03/2020   ALT 13 09/03/2020   AST 15 09/03/2020   NA 137 09/03/2020   K 4.2 09/03/2020   CL 104 09/03/2020   CREATININE 0.84 09/03/2020   BUN 15 09/03/2020   CO2  26 09/03/2020   TSH 1.30 11/15/2019   HGBA1C 7.0 (H) 09/03/2020   MICROALBUR 1.1 11/15/2019    MM 3D SCREEN BREAST BILATERAL  Result Date: 08/15/2019 CLINICAL DATA:  Screening. EXAM: DIGITAL SCREENING BILATERAL MAMMOGRAM WITH TOMO AND CAD COMPARISON:  Previous exam(s). ACR Breast Density Category a: The breast tissue is almost entirely fatty. FINDINGS: There are no findings suspicious for malignancy. Images were processed with CAD. IMPRESSION: No mammographic evidence of malignancy. A result letter of this screening mammogram will be mailed directly to the patient. RECOMMENDATION: Screening mammogram in one year. (Code:[id number]-B-01Y) BI-RADS CATEGORY  1: Negative. Electronically Signed   By: Ammie Ferrier M.D.   On: 08/15/2019 14:21       Assessment & Plan:   Problem List Items Addressed This Visit     Breast cancer screening   Relevant Orders   MM 3D SCREEN BREAST BILATERAL   Colon cancer screening    Age 52.  Due screening colonoscopy.  Will notify me when agreeable.        HPV in female    Has been followed by gyn.  Has IUD in place.  She will contact GYN for f/u.         Hyperlipidemia    Low cholesterol diet and exercise.  On crestor.  Follow lipid panel and liver function tests.        Relevant Orders   Lipid panel (Completed)   Hepatic function panel (Completed)   Stress    Increased stress as outlined.  Doing better.  Son coming home next week.  Has good support. Does not feel needs any further intervention at this time.  Follow.        Tobacco use    Has cut down significantly.  Plans to quit prior to her son coming home next week.  Follow.         Type 2 diabetes mellitus without complication, without long-term current use of insulin ([facility])    Continue ozempic and  metformin.  Sugars as outlined.  Low carb diet and exercise.  Follow met b and a1c. Check today.        Relevant Orders   Hemoglobin A1c (Completed)   Basic metabolic panel (Completed)     Einar Pheasant, MD

## 2020-09-08 DIAGNOSIS — F439 Reaction to severe stress, unspecified: Secondary | ICD-10-CM | POA: Insufficient documentation

## 2020-09-08 NOTE — Assessment & Plan Note (Signed)
Has cut down significantly.  Plans to quit prior to her son coming home next week.  Follow.

## 2020-09-08 NOTE — Assessment & Plan Note (Signed)
Continue ozempic and metformin.  Sugars as outlined.  Low carb diet and exercise.  Follow met b and a1c. Check today.

## 2020-09-08 NOTE — Assessment & Plan Note (Signed)
Low cholesterol diet and exercise.  On crestor.  Follow lipid panel and liver function tests.  

## 2020-09-08 NOTE — Assessment & Plan Note (Signed)
Increased stress as outlined.  Doing better.  Son coming home next week.  Has good support. Does not feel needs any further intervention at this time.  Follow.

## 2020-09-08 NOTE — Assessment & Plan Note (Signed)
Has been followed by gyn.  Has IUD in place.  She will contact GYN for f/u.

## 2020-09-08 NOTE — Assessment & Plan Note (Signed)
Age 45.  Due screening colonoscopy.  Will notify me when agreeable.

## 2020-09-14 MED ORDER — OZEMPIC (0.25 OR 0.5 MG/DOSE) 2 MG/1.5ML ~~LOC~~ SOPN: 0.5000 mg | PEN_INJECTOR | SUBCUTANEOUS | 1 refills | Status: DC

## 2020-09-14 NOTE — Addendum Note (Signed)
Addended by: Elise Benne T on: 09/14/2020 09:20 AM   Modules accepted: Orders

## 2020-09-17 ENCOUNTER — Ambulatory Visit: Payer: BC Managed Care – PPO | Admitting: Pharmacist

## 2020-09-17 DIAGNOSIS — E785 Hyperlipidemia, unspecified: Secondary | ICD-10-CM

## 2020-09-17 DIAGNOSIS — E119 Type 2 diabetes mellitus without complications: Secondary | ICD-10-CM

## 2020-09-17 NOTE — Patient Instructions (Signed)
Visit Information   Goals Addressed               This Visit's Progress     Patient Stated     Disease Progression Prevention (pt-stated)        Patient Goals/Self-Care Activities Over the next 90 days, patient will:  - take medications as prescribed engage in dietary modifications by reducing consumption of trigger foods        Patient verbalizes understanding of instructions provided today and agrees to view in MyChart.  Plan: Telephone follow up appointment with care management team member scheduled for:  ~ 8 weeks  Catie Feliz Beam, PharmD, [place], CPP Clinical Pharmacist [facility] at [facility] [phone]

## 2020-09-17 NOTE — Chronic Care Management (AMB) (Signed)
Care Management   Pharmacy Note  09/17/2020 Name: Colleen Wagner MRN: [id number] DOB: 1975/10/07  Subjective: Colleen Wagner is a 45 y.o. year old female who is a primary care patient of Einar Pheasant, MD. The Care Management team was consulted for assistance with care management and care coordination needs.    Engaged with patient by telephone for follow up visit in response to provider referral for pharmacy case management and/or care coordination services.   The patient was given information about Care Management services today including:  Care Management services includes personalized support from designated clinical staff supervised by the patient's primary care provider, including individualized plan of care and coordination with other care providers. 24/7 contact phone numbers for assistance for urgent and routine care needs. The patient may stop case management services at any time by phone call to the office staff.  Patient agreed to services and consent obtained.  Assessment:  Review of patient status, including review of consultants reports, laboratory and other test data, was performed as part of comprehensive evaluation and provision of chronic care management services.   SDOH (Social Determinants of Health) assessments and interventions performed:    Objective:  Lab Results  Component Value Date   CREATININE 0.84 09/03/2020   CREATININE 0.72 05/21/2020   CREATININE 0.72 01/29/2020    Lab Results  Component Value Date   HGBA1C 7.0 (H) 09/03/2020       Component Value Date/Time   CHOL 158 09/03/2020 0808   TRIG 179.0 (H) 09/03/2020 0808   HDL 40.60 09/03/2020 0808   CHOLHDL 4 09/03/2020 0808   VLDL 35.8 09/03/2020 0808   LDLCALC 81 09/03/2020 0808    Clinical ASCVD: No  The 10-year ASCVD risk score Mikey Bussing DC Jr., et al., 2013) is: 6.1%   Values used to calculate the score:     Age: 41 years     Sex: Female     Is Non-Hispanic African American: No      Diabetic: Yes     Tobacco smoker: Yes     Systolic Blood Pressure: 256 mmHg     Is BP treated: No     HDL Cholesterol: 40.6 mg/dL     Total Cholesterol: 158 mg/dL    BP Readings from Last 3 Encounters:  09/03/20 (!) 132/94  05/21/20 112/82  01/29/20 122/88    Care Plan  No Known Allergies  Medications Reviewed Today     Reviewed by De Hollingshead, RPH-CPP (Pharmacist) on 09/17/20 at [place] List Status: <None>   Medication Order Taking? Sig Documenting Provider Last Dose Status Informant  blood glucose meter kit and supplies [id number]  Dispense based on patient and insurance preference. Use up to four times daily as directed. (FOR ICD-10 E10.9, E11.9). Marval Regal, NP  Active   levonorgestrel (MIRENA) 20 MCG/24HR IUD [id number] Yes by Intrauterine route. [provider] Taking Active   metFORMIN (GLUCOPHAGE XR) 500 MG 24 hr tablet [id number] Yes Start 573m PO BID SEinar Pheasant MD Taking Active   rosuvastatin (CRESTOR) 5 MG tablet [id number] Take 1 tablet (5 mg total) by mouth daily. MMarval Regal NP Taking Active   Semaglutide,0.25 or 0.5MG/DOS, (OZEMPIC, 0.25 OR 0.5 MG/DOSE,) 2 MG/1.5ML SOPN [id number] Inject 0.5 mg into the skin once a week. 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   Interventions: 1:1 collaboration with Einar Pheasant, MD regarding development and update of comprehensive plan of care as evidenced by provider attestation and co-signature Inter-disciplinary care team collaboration (see longitudinal plan of care) Comprehensive medication review performed; medication list updated in electronic medical record  Diabetes with Obesity: Controlled per A1c, but with desire for weigh loss; current treatment: metformin 500 mg BID, Ozempic 0.5 mg weekly - increasing today Current blood sugars: periodically checking, fasting 120s, post prandial: 143  Current meal patterns: breakfast: bacon, egg, cheese sandwich, fried liver pudding sandwich; 1 pm lunch: leftovers from cooking dinner, take out; no supper;  Current exercise: 30 minutes 3x times weekly- 2 months Recommended to continue current regimen at this time. Discussed ability to increase dose moving forward to target weight management. She verbalized understanding  Hyperlipidemia: Controlled per last lab vales; current treatment: rosuvastatin 5 mg  Previously recommended to continue current regimen at this time  Contraception: Appropriately managed; current regimen: Mirena IUD Previously recommended to continue current regimen at this time   Patient Goals/Self-Care Activities Over the next 90 days, patient will:  - take medications as prescribed engage in dietary modifications by reducing consumption of trigger foods  Follow Up Plan: Telephone follow up appointment with care  management team member scheduled for: ~ 8 weeks      Medication Assistance:  None required.  Patient affirms current coverage meets needs.  Follow Up:  Patient agrees to Care Plan and Follow-up.  Plan: Telephone follow up appointment with care management team member scheduled for:  ~ 8 weeks  Catie Darnelle Maffucci, PharmD, [place], [employer] [place] [employer] Clinical Pharmacist [employer] at [employer] [phone]

## 2020-09-22 ENCOUNTER — Ambulatory Visit
Admission: RE | Admit: 2020-09-22 | Discharge: 2020-09-22 | Disposition: A | Discharge status: Home / Self Care | Payer: BC Managed Care – PPO | Source: Ambulatory Visit | Attending: Internal Medicine | Admitting: Internal Medicine

## 2020-09-22 ENCOUNTER — Other Ambulatory Visit: Payer: Self-pay

## 2020-09-22 DIAGNOSIS — Z1231 Encounter for screening mammogram for malignant neoplasm of breast: Secondary | ICD-10-CM | POA: Diagnosis present

## 2020-09-23 ENCOUNTER — Encounter: Payer: Self-pay | Admitting: Internal Medicine

## 2020-09-23 DIAGNOSIS — Z0000 Encounter for general adult medical examination without abnormal findings: Secondary | ICD-10-CM | POA: Insufficient documentation

## 2020-10-26 ENCOUNTER — Ambulatory Visit: Payer: BC Managed Care – PPO | Admitting: Pharmacist

## 2020-10-26 VITALS — Wt 238.0 lb

## 2020-10-26 DIAGNOSIS — E119 Type 2 diabetes mellitus without complications: Secondary | ICD-10-CM

## 2020-10-26 DIAGNOSIS — E785 Hyperlipidemia, unspecified: Secondary | ICD-10-CM

## 2020-10-26 NOTE — Patient Instructions (Signed)
Visit Information   Goals Addressed               This Visit's Progress     Patient Stated     Disease Progression Prevention (pt-stated)        Patient Goals/Self-Care Activities Over the next 90 days, patient will:  - take medications as prescribed engage in dietary modifications by reducing consumption of trigger foods        Patient verbalizes understanding of instructions provided today and agrees to view in MyChart.  Plan: Telephone follow up appointment with care management team member scheduled for:  ~ 8 weeks  Catie Dorri Ozturk, PharmD, BCACP, CPP Clinical Pharmacist  [facility] at [place] [phone] 

## 2020-10-26 NOTE — Chronic Care Management (AMB) (Signed)
Care Management   Pharmacy Note  10/26/2020 Name: Colleen Wagner MRN: [id number] DOB: [birth year]  Subjective: Colleen Wagner is a 45 y.o. year old female who is a primary care patient of Einar Pheasant, MD. The Care Management team was consulted for assistance with care management and care coordination needs.    Engaged with patient by telephone for follow up visit in response to provider referral for pharmacy case management and/or care coordination services.   The patient was given information about Care Management services today including:  Care Management services includes personalized support from designated clinical staff supervised by the patient's primary care provider, including individualized plan of care and coordination with other care providers. 24/7 contact phone numbers for assistance for urgent and routine care needs. The patient may stop case management services at any time by phone call to the office staff.  Patient agreed to services and consent obtained.  Assessment:  Review of patient status, including review of consultants reports, laboratory and other test data, was performed as part of comprehensive evaluation and provision of chronic care management services.   SDOH (Social Determinants of Health) assessments and interventions performed:    Objective:  Lab Results  Component Value Date   CREATININE 0.84 09/03/2020   CREATININE 0.72 05/21/2020   CREATININE 0.72 01/29/2020    Lab Results  Component Value Date   HGBA1C 7.0 (H) 09/03/2020       Component Value Date/Time   CHOL 158 09/03/2020 0808   TRIG 179.0 (H) 09/03/2020 0808   HDL 40.60 09/03/2020 0808   CHOLHDL 4 09/03/2020 0808   VLDL 35.8 09/03/2020 0808   LDLCALC 81 09/03/2020 0808    Clinical ASCVD: No  The 10-year ASCVD risk score Mikey Bussing DC Jr., et al., 2013) is: 6.1%   Values used to calculate the score:     Age: 27 years     Sex: Female     Is Non-Hispanic African American:  No     Diabetic: Yes     Tobacco smoker: Yes     Systolic Blood Pressure: 008 mmHg     Is BP treated: No     HDL Cholesterol: 40.6 mg/dL     Total Cholesterol: 158 mg/dL      BP Readings from Last 3 Encounters:  09/03/20 (!) 132/94  05/21/20 112/82  01/29/20 122/88    Care Plan  No Known Allergies  Medications Reviewed Today     Reviewed by De Hollingshead, RPH-CPP (Pharmacist) on 10/26/20 at [place] List Status: <None>   Medication Order Taking? Sig Documenting Provider Last Dose Status Informant  blood glucose meter kit and supplies [id number]  Dispense based on patient and insurance preference. Use up to four times daily as directed. (FOR ICD-10 E10.9, E11.9). Marval Regal, NP  Active   levonorgestrel (MIRENA) 20 MCG/24HR IUD [id number] Yes by Intrauterine route. [provider] Taking Active   metFORMIN (GLUCOPHAGE XR) 500 MG 24 hr tablet [id number] Yes Start 545m PO BID SEinar Pheasant MD Taking Active   rosuvastatin (CRESTOR) 5 MG tablet [id number] Take 1 tablet (5 mg total) by mouth daily. MMarval Regal NP Taking Active   Semaglutide,0.25 or 0.5MG/DOS, (OZEMPIC, 0.25 OR 0.5 MG/DOSE,) 2 MG/1.5ML SOPN [id number] Inject 0.5 mg into the skin once a week.  Patient not taking: Reported on 10/26/2020   SEinar Pheasant MD Not Taking Active             Patient Active  Problem List   Diagnosis Date Noted   Healthcare maintenance 09/23/2020   Stress 09/08/2020   Breast cancer screening 05/30/2020   Colon cancer screening 05/30/2020   Nicotine use disorder 11/29/2019   Menorrhagia with irregular cycle 11/08/2019   Hyperlipidemia 11/08/2019   Tobacco use 11/08/2019   Chronic right shoulder pain 11/08/2019   Acquired deformity of left foot 11/08/2019   Type 2 diabetes mellitus without complication, without long-term current use of insulin ([place]) 08/14/2019   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   Interventions: 1:1 collaboration with Einar Pheasant, MD regarding development and update of comprehensive plan of care as evidenced by provider attestation and co-signature Inter-disciplinary care team collaboration (see longitudinal plan of care) Comprehensive medication review performed; medication list updated in electronic medical record  Diabetes with Obesity: Controlled per A1c, but with desire for weight loss; current treatment: metformin 500 mg BID, Ozempic 0.5 mg weekly Does note some periodic queasiness, but takes a famotidine PRN and this helps Baseline weight: 247 lbs; most recent weight: 238 lbs Current meal patterns: breakfast: 2 scrambled eggs, bacon, liver pudding, occasional unfrosted poptart; 1 pm lunch: leftovers from cooking dinner, take out; generally skipping supper, but was doing this at baseline. Snacks: usually nothing, but snack sized chips occasionally; Drinks: water, diet pepsi w/ lunch and sometimes with supper; got a 1 L water bottle and is trying to get a full gallon in daily  Current exercise: Has a membership at the gym, but is working on finding a Public librarian to watch her kids while she goes Discussed ability to increase dose moving forward to target greater weight loss. Also discussed potential to switch to [facility] given superior data for weight management and A1c reduction. Patient has an upcoming appointment with Dr. Ouida Sills at [facility] for weight management and will discuss options and recommendations with him.  Hyperlipidemia: Controlled per last lab vales; current treatment: rosuvastatin 5 mg daily Previously recommended to continue current regimen at this time  Contraception: Appropriately managed; current regimen: Mirena IUD Previously recommended to continue current regimen at this time.    Patient Goals/Self-Care Activities Over the next 90 days, patient will:  - take medications as prescribed engage in dietary modifications by reducing consumption of trigger foods  Follow Up Plan: Telephone follow up appointment with care management team member scheduled for: ~ 8 weeks      Medication Assistance:  None required.  Patient affirms current coverage meets needs.  Follow Up:  Patient agrees to Care Plan and Follow-up.  Plan: Telephone follow up appointment with care management team member scheduled for:  ~ 8 weeks  Catie Darnelle Maffucci, PharmD, [place], [place] Clinical Pharmacist [employer] at [employer] [phone]

## 2020-12-04 ENCOUNTER — Encounter: Payer: Self-pay | Admitting: Internal Medicine

## 2020-12-04 MED ORDER — METFORMIN HCL ER 500 MG PO TB24: ORAL_TABLET | ORAL | 1 refills | Status: DC

## 2020-12-04 NOTE — Telephone Encounter (Signed)
Rx sent in for metformin.

## 2020-12-06 NOTE — Telephone Encounter (Signed)
Colleen Wagner, Rosey Bath is interested in trying mounjaro.  Let me know if you need me to do something more - regarding insurance, etc.

## 2020-12-09 ENCOUNTER — Ambulatory Visit: Payer: BC Managed Care – PPO | Admitting: Pharmacist

## 2020-12-09 ENCOUNTER — Telehealth: Payer: Self-pay | Admitting: Pharmacist

## 2020-12-09 DIAGNOSIS — E119 Type 2 diabetes mellitus without complications: Secondary | ICD-10-CM

## 2020-12-09 MED ORDER — TIRZEPATIDE 5 MG/0.5ML ~~LOC~~ SOAJ: 5.0000 mg | SUBCUTANEOUS | 2 refills | Status: DC

## 2020-12-09 MED ORDER — TIRZEPATIDE 2.5 MG/0.5ML ~~LOC~~ SOAJ: 2.5000 mg | SUBCUTANEOUS | 0 refills | Status: DC

## 2020-12-09 NOTE — Chronic Care Management (AMB) (Signed)
Care Management   Pharmacy Note  12/09/2020 Name: Colleen Wagner MRN: [id number] DOB: 1975-03-09  Subjective: Colleen Wagner is a 45 y.o. year old female who is a primary care patient of Einar Pheasant, MD. The Care Management team was consulted for assistance with care management and care coordination needs.    Engaged with patient by telephone for follow up visit in response to provider referral for pharmacy case management and/or care coordination services.   The patient was given information about Care Management services today including:  Care Management services includes personalized support from designated clinical staff supervised by the patient's primary care provider, including individualized plan of care and coordination with other care providers. 24/7 contact phone numbers for assistance for urgent and routine care needs. The patient may stop case management services at any time by phone call to the office staff.  Patient agreed to services and consent obtained.  Assessment:  Review of patient status, including review of consultants reports, laboratory and other test data, was performed as part of comprehensive evaluation and provision of chronic care management services.   SDOH (Social Determinants of Health) assessments and interventions performed:    Objective:  Lab Results  Component Value Date   CREATININE 0.84 09/03/2020   CREATININE 0.72 05/21/2020   CREATININE 0.72 01/29/2020    Lab Results  Component Value Date   HGBA1C 7.0 (H) 09/03/2020       Component Value Date/Time   CHOL 158 09/03/2020 0808   TRIG 179.0 (H) 09/03/2020 0808   HDL 40.60 09/03/2020 0808   CHOLHDL 4 09/03/2020 0808   VLDL 35.8 09/03/2020 0808   LDLCALC 81 09/03/2020 0808     BP Readings from Last 3 Encounters:  09/03/20 (!) 132/94  05/21/20 112/82  01/29/20 122/88    Care Plan  No Known Allergies  Medications Reviewed Today     Reviewed by De Hollingshead, RPH-CPP (Pharmacist) on 10/26/20 at 56  Med List Status: <None>   Medication Order Taking? Sig Documenting Provider Last Dose Status Informant  blood glucose meter kit and supplies [id number]  Dispense based on patient and insurance preference. Use up to four times daily as directed. (FOR ICD-10 E10.9, E11.9). Marval Regal, NP  Active   levonorgestrel (MIRENA) 20 MCG/24HR IUD [id number] Yes by Intrauterine route. [provider] Taking Active   metFORMIN (GLUCOPHAGE XR) 500 MG 24 hr tablet [id number] Yes Start 521m PO BID SEinar Pheasant MD Taking Active   rosuvastatin (CRESTOR) 5 MG tablet [id number] Take 1 tablet (5 mg total) by mouth daily. MMarval Regal NP Taking Active   Semaglutide,0.25 or 0.5MG/DOS, (OZEMPIC, 0.25 OR 0.5 MG/DOSE,) 2 MG/1.5ML SOPN [id number] Inject 0.5 mg into the skin once a week.  Patient not taking: Reported on 10/26/2020   SEinar Pheasant MD Not Taking Active             Patient Active Problem List   Diagnosis Date Noted   Healthcare maintenance 09/23/2020   Stress 09/08/2020   Breast cancer screening 05/30/2020   Colon cancer screening 05/30/2020   Nicotine use disorder 11/29/2019   Menorrhagia with irregular cycle 11/08/2019   Hyperlipidemia 11/08/2019   Tobacco use 11/08/2019   Chronic right shoulder pain 11/08/2019   Acquired deformity of left foot 11/08/2019   Type 2 diabetes mellitus without complication, without long-term current use of insulin ([place] 08/14/2019   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   Interventions: 1:1 collaboration with Einar Pheasant, MD regarding development and update of comprehensive plan of care as evidenced by provider attestation and co-signature Inter-disciplinary care team collaboration (see longitudinal plan of care) Comprehensive medication review performed; medication list updated in electronic medical record  Diabetes with Obesity: Controlled per A1c, but with desire for weight loss; current treatment: metformin 500 mg BID, Ozempic 0.5 mg weekly Does note some periodic queasiness, but takes a famotidine PRN and this helps Baseline weight: 247 lbs; most recent weight: 238 lbs Notes continued concerns with indigestion and GI upset. Interested in trialing Mounjaro to see if better tolerated. Discussed slightly different structure/mechanism and improved glycemic and weight loss benefit vs Ozempic. Patient interested in changing. Discontinue Ozempic. Start Mounjaro 2.5 mg weekly for 4 weeks, then increase to 5 mg weekly. Will provide sample of 2.5 mg strength to start therapy. PA for [facility] completed and submitted today  Hyperlipidemia: Controlled per last lab vales; current treatment: rosuvastatin 5 mg daily Previously recommended to continue current regimen at this time  Contraception: Appropriately managed; current regimen: Mirena IUD Previously recommended to continue current regimen at this time.    Patient Goals/Self-Care Activities Over the next 90 days, patient will:  - take medications as prescribed engage in dietary modifications by reducing consumption of trigger foods  Follow Up Plan: Telephone follow up appointment with care management team member scheduled for: ~ 4 weeks      Medication Assistance:  None required.  Patient affirms current coverage meets needs.  Follow Up:  Patient agrees to Care Plan and Follow-up.  Plan:  Telephone follow up appointment with care management team member scheduled for:  4 weeks  Catie Darnelle Maffucci, PharmD, [place], [place] Clinical Pharmacist [employer] at [employer] [phone]

## 2020-12-09 NOTE — Telephone Encounter (Signed)
  Chronic Care Management   Note  12/09/2020 Name: Mailee Klaas MRN: [id number] DOB: May 13, 1975   Attempted to contact patient for scheduled appointment for medication management support. Left HIPAA compliant message for patient to return my call at their convenience.    Plan: - If I do not hear back from the patient by end of business today, will collaborate with Care Guide to outreach to schedule follow up with me  Catie Feliz Beam, PharmD, [place], CPP Clinical Pharmacist [employer] at [employer] [phone]

## 2020-12-09 NOTE — Telephone Encounter (Signed)
Patient returned call

## 2020-12-09 NOTE — Patient Instructions (Signed)
Visit Information   Goals Addressed               This Visit's Progress     Patient Stated     Disease Progression Prevention (pt-stated)        Patient Goals/Self-Care Activities Over the next 90 days, patient will:  - take medications as prescribed engage in dietary modifications by reducing consumption of trigger foods        Patient verbalizes understanding of instructions provided today and agrees to view in MyChart.    Plan: Telephone follow up appointment with care management team member scheduled for:  4 weeks  Catie Feliz Beam, PharmD, [place], CPP Clinical Pharmacist [employer] at [employer] [phone]

## 2020-12-09 NOTE — Telephone Encounter (Signed)
Medication Samples have been labeled and logged for the patient. She will come pick up later this afternoon  Drug name: Mounjaro  Strength: 2.5 mg        Qty: 1 box  LOT: [id number] D Exp.Date: 07/16/2022  Dosing instructions: Inject 2.5 mg weekly for 4 weeks  The patient has been instructed regarding the correct time, dose, and frequency of taking this medication, including desired effects and most common side effects.   Lourena Simmonds 2:42 PM 12/09/2020

## 2020-12-09 NOTE — Telephone Encounter (Signed)
Patient sample picked up

## 2020-12-11 ENCOUNTER — Ambulatory Visit: Payer: BC Managed Care – PPO | Admitting: Pharmacist

## 2020-12-11 DIAGNOSIS — E785 Hyperlipidemia, unspecified: Secondary | ICD-10-CM

## 2020-12-11 DIAGNOSIS — E119 Type 2 diabetes mellitus without complications: Secondary | ICD-10-CM

## 2020-12-11 NOTE — Chronic Care Management (AMB) (Signed)
Care Management   Pharmacy Note  12/11/2020 Name: Colleen Wagner MRN: [id number] DOB: 02/09/1976  Subjective: Colleen Wagner is a 45 y.o. year old female who is a primary care patient of Scott, Charlene, MD. The Care Management team was consulted for assistance with care management and care coordination needs.    Care coordination  for  medication access  in response to provider referral for pharmacy case management and/or care coordination services.   The patient was given information about Care Management services today including:  Care Management services includes personalized support from designated clinical staff supervised by the patient's primary care provider, including individualized plan of care and coordination with other care providers. 24/7 contact phone numbers for assistance for urgent and routine care needs. The patient may stop case management services at any time by phone call to the office staff.  Patient agreed to services and consent obtained.  Assessment:  Review of patient status, including review of consultants reports, laboratory and other test data, was performed as part of comprehensive evaluation and provision of chronic care management services.   SDOH (Social Determinants of Health) assessments and interventions performed:    Objective:  Lab Results  Component Value Date   CREATININE 0.84 09/03/2020   CREATININE 0.72 05/21/2020   CREATININE 0.72 01/29/2020    Lab Results  Component Value Date   HGBA1C 7.0 (H) 09/03/2020       Component Value Date/Time   CHOL 158 09/03/2020 0808   TRIG 179.0 (H) 09/03/2020 0808   HDL 40.60 09/03/2020 0808   CHOLHDL 4 09/03/2020 0808   VLDL 35.8 09/03/2020 0808   LDLCALC 81 09/03/2020 0808      BP Readings from Last 3 Encounters:  09/03/20 (!) 132/94  05/21/20 112/82  01/29/20 122/88    Care Plan  No Known Allergies  Medications Reviewed Today     Reviewed by Travis, Catherine E,  RPH-CPP (Pharmacist) on 10/26/20 at 1603  Med List Status: <None>   Medication Order Taking? Sig Documenting Provider Last Dose Status Informant  blood glucose meter kit and supplies [id number]  Dispense based on patient and insurance preference. Use up to four times daily as directed. (FOR ICD-10 E10.9, E11.9). Mills, Kimberly A, NP  Active   levonorgestrel (MIRENA) 20 MCG/24HR IUD [id number] Yes by Intrauterine route. [provider] Taking Active   metFORMIN (GLUCOPHAGE XR) 500 MG 24 hr tablet [id number] Yes Start 500mg PO BID Scott, Charlene, MD Taking Active   rosuvastatin (CRESTOR) 5 MG tablet [id number] Yes Take 1 tablet (5 mg total) by mouth daily. Mills, Kimberly A, NP Taking Active   Semaglutide,0.25 or 0.5MG/DOS, (OZEMPIC, 0.25 OR 0.5 MG/DOSE,) 2 MG/1.5ML SOPN [id number] No Inject 0.5 mg into the skin once a week.  Patient not taking: Reported on 10/26/2020   Scott, Charlene, MD Not Taking Active             Patient Active Problem List   Diagnosis Date Noted   Healthcare maintenance 09/23/2020   Stress 09/08/2020   Breast cancer screening 05/30/2020   Colon cancer screening 05/30/2020   Nicotine use disorder 11/29/2019   Menorrhagia with irregular cycle 11/08/2019   Hyperlipidemia 11/08/2019   Tobacco use 11/08/2019   Chronic right shoulder pain 11/08/2019   Acquired deformity of left foot 11/08/2019   Type 2 diabetes mellitus without complication, without long-term current use of insulin (HCC) 08/14/2019   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   Interventions: 1:1 collaboration with Einar Pheasant, MD regarding development and update of comprehensive plan of care as evidenced by provider attestation and co-signature Inter-disciplinary care team collaboration (see longitudinal plan of care) Comprehensive medication review performed; medication list updated in electronic medical record  Diabetes with Obesity: Controlled per A1c, but with desire for weight loss; current treatment: metformin 500 mg BID, Ozempic 0.5 mg weekly Does note some periodic queasiness, but takes a famotidine PRN and this helps Baseline weight: 247 lbs; most recent weight: 238 lbs Received PA denial for Mounjaro as patient has not tried and failed all formulary alternatives: Ozempic, Rybelsus, Trulicity, and Victoza. Contacted [facility]. Provided justification that patient did not achieve desired clinical outcomes on Ozempic, and it would be inappropriate to trial Rybelsus, Trulicity, or Victoza given the inferior A1c reduction and weight loss achieved in their respective clinical trials (PIONEER, AWARD, AND LEADER respectively) in comparison to the A1c and weight loss outcomes seen in the SURPASS clinical trial for Mounjaro. PA approved for a year.   Hyperlipidemia: Controlled per last lab vales; current treatment: rosuvastatin 5 mg daily Previously recommended to continue current regimen at this time  Contraception: Appropriately managed; current regimen: Mirena IUD Previously recommended to continue current regimen at this time.    Patient Goals/Self-Care Activities Over the next 90 days, patient will:  - take medications as prescribed engage in dietary modifications by reducing consumption of trigger foods  Follow Up Plan: Telephone follow up appointment with care management team member scheduled for: ~ 4 weeks      Medication Assistance:   None required.  Patient affirms current coverage meets needs.  Follow Up:  Patient agrees to Care Plan and Follow-up.  Plan: Telephone follow up appointment with care management team member scheduled for:  ~ 8 weeks  Catie Darnelle Maffucci, PharmD, [place], [place] Clinical Pharmacist [employer] at [employer] [phone]

## 2020-12-11 NOTE — Patient Instructions (Signed)
Visit Information   Goals Addressed               This Visit's Progress     Patient Stated     Disease Progression Prevention (pt-stated)        Patient Goals/Self-Care Activities Over the next 90 days, patient will:  - take medications as prescribed engage in dietary modifications by reducing consumption of trigger foods        Patient verbalizes understanding of instructions provided today and agrees to view in MyChart.  Plan: Telephone follow up appointment with care management team member scheduled for:  ~ 8 weeks  Catie Kaysen Deal, PharmD, BCACP, CPP Clinical Pharmacist [facility] at [place] [phone] 

## 2020-12-14 ENCOUNTER — Telehealth: Payer: Self-pay | Admitting: Pharmacist

## 2020-12-14 NOTE — Telephone Encounter (Signed)
PA for [facility] approved 12/11/2020-12/11/2021.

## 2020-12-18 ENCOUNTER — Other Ambulatory Visit: Payer: Self-pay | Admitting: Internal Medicine

## 2020-12-21 ENCOUNTER — Telehealth: Payer: BC Managed Care – PPO

## 2021-01-04 ENCOUNTER — Ambulatory Visit: Payer: BC Managed Care – PPO | Admitting: Pharmacist

## 2021-01-04 DIAGNOSIS — E119 Type 2 diabetes mellitus without complications: Secondary | ICD-10-CM

## 2021-01-04 MED ORDER — TIRZEPATIDE 5 MG/0.5ML ~~LOC~~ SOAJ: 5.0000 mg | SUBCUTANEOUS | 2 refills | Status: DC

## 2021-01-04 NOTE — Patient Instructions (Signed)
Visit Information   Goals Addressed               This Visit's Progress     Patient Stated     Disease Progression Prevention (pt-stated)        Patient Goals/Self-Care Activities Over the next 90 days, patient will:  - take medications as prescribed engage in dietary modifications by reducing consumption of trigger foods        Patient verbalizes understanding of instructions provided today and agrees to view in MyChart.    Plan: Telephone follow up appointment with care management team member scheduled for:  3 weeks as previously scheduled  Catie Feliz Beam, PharmD, [place], CPP Clinical Pharmacist [employer] at [employer] [phone]

## 2021-01-04 NOTE — Chronic Care Management (AMB) (Signed)
Chronic Care Management CCM Pharmacy Note  01/04/2021 Name:  Colleen Wagner MRN:  [id number] DOB:  [birth year]  Subjective: Colleen Wagner is an 45 y.o. year old female who is a primary patient of Einar Pheasant, MD.  The CCM team was consulted for assistance with disease management and care coordination needs.    Care coordination for  medication access  for pharmacy case management and/or care coordination services.   Objective:  Medications Reviewed Today     Reviewed by De Hollingshead, RPH-CPP (Pharmacist) on 10/26/20 at 58  Med List Status: <None>   Medication Order Taking? Sig Documenting Provider Last Dose Status Informant  blood glucose meter kit and supplies [id number]  Dispense based on patient and insurance preference. Use up to four times daily as directed. (FOR ICD-10 E10.9, E11.9). Marval Regal, NP  Active   levonorgestrel (MIRENA) 20 MCG/24HR IUD [id number] Yes by Intrauterine route. [provider] Taking Active   metFORMIN (GLUCOPHAGE XR) 500 MG 24 hr tablet [id number] Yes Start 549m PO BID SEinar Pheasant MD Taking Active   rosuvastatin (CRESTOR) 5 MG tablet [id number] Take 1 tablet (5 mg total) by mouth daily. MMarval Regal NP Taking Active   Semaglutide,0.25 or 0.5MG/DOS, (OZEMPIC, 0.25 OR 0.5 MG/DOSE,) 2 MG/1.5ML SOPN [id number] Inject 0.5 mg into the skin once a week.  Patient not taking: Reported on 10/26/2020   SEinar Pheasant MD Not Taking Active             Pertinent Labs:   Lab Results  Component Value Date   HGBA1C 7.0 (H) 09/03/2020   Lab Results  Component Value Date   CHOL 158 09/03/2020   HDL 40.60 09/03/2020   LDLCALC 81 09/03/2020   TRIG 179.0 (H) 09/03/2020   CHOLHDL 4 09/03/2020   Lab Results  Component Value Date   CREATININE 0.84 09/03/2020   BUN 15 09/03/2020   NA 137 09/03/2020   K 4.2 09/03/2020   CL 104 09/03/2020   CO2 26 09/03/2020     SDOH:  (Social Determinants of Health)  assessments and interventions performed: Yes SDOH Interventions    Flowsheet Row Most Recent Value  SDOH Interventions   Financial Strain Interventions Other (Comment)       CCM Care Plan  Review of patient past medical history, allergies, medications, health status, including review of consultants reports, laboratory and other test data, was performed as part of comprehensive evaluation and provision of chronic care management services.   Conditions to be addressed/monitored:  Hyperlipidemia and Diabetes  Care Plan : Medication Management  Updates made by TDe Hollingshead RPH-CPP since 01/04/2021 12:00 AM     Problem: Diabetes, HLD, Obesity      Long-Range Goal: Disease Progression Prevention   Start Date: 07/16/2020  Recent Progress: On track  Priority: High  Note:   Current Barriers:  Unable to achieve control of weight   Pharmacist Clinical Goal(s):  Over the next 90 days, patient will achieve improvement in weight through collaboration with PharmD and provider.   Interventions: 1:1 collaboration with SEinar Pheasant MD regarding development and update of comprehensive plan of care as evidenced by provider attestation and co-signature Inter-disciplinary care team collaboration (see longitudinal plan of care) Comprehensive medication review performed; medication list updated in electronic medical record  Diabetes with Obesity: Controlled per A1c, but with desire for weight loss; current treatment: metformin 500 mg BID, Ozempic 0.5 mg weekly Does note some periodic queasiness, but takes  a famotidine PRN and this helps Baseline weight: 247 lbs; most recent weight: 238 lbs Resent script for Mounjaro to [facility] as requested via myChart  Hyperlipidemia: Controlled per last lab vales; current treatment: rosuvastatin 5 mg daily Previously recommended to continue current regimen at this time  Contraception: Appropriately managed; current regimen: Mirena  IUD Previously recommended to continue current regimen at this time.    Patient Goals/Self-Care Activities Over the next 90 days, patient will:  - take medications as prescribed engage in dietary modifications by reducing consumption of trigger foods      Plan: Telephone follow up appointment with care management team member scheduled for:  3 weeks as previously scheduled  Catie Darnelle Maffucci, PharmD, [place], [place] Pharmacist [employer] at [employer] [phone]

## 2021-01-08 ENCOUNTER — Ambulatory Visit (INDEPENDENT_AMBULATORY_CARE_PROVIDER_SITE_OTHER): Payer: BC Managed Care – PPO | Admitting: Internal Medicine

## 2021-01-08 ENCOUNTER — Other Ambulatory Visit: Payer: Self-pay

## 2021-01-08 VITALS — BP 128/72 | HR 92 | Temp 97.1°F | Resp 16 | Ht 68.0 in | Wt 239.0 lb

## 2021-01-08 DIAGNOSIS — E119 Type 2 diabetes mellitus without complications: Secondary | ICD-10-CM | POA: Diagnosis not present

## 2021-01-08 DIAGNOSIS — F439 Reaction to severe stress, unspecified: Secondary | ICD-10-CM | POA: Diagnosis not present

## 2021-01-08 DIAGNOSIS — K219 Gastro-esophageal reflux disease without esophagitis: Secondary | ICD-10-CM

## 2021-01-08 DIAGNOSIS — E785 Hyperlipidemia, unspecified: Secondary | ICD-10-CM | POA: Diagnosis not present

## 2021-01-08 LAB — HEPATIC FUNCTION PANEL
ALT: 12 U/L (ref 0–35)
AST: 16 U/L (ref 0–37)
Albumin: 4.1 g/dL (ref 3.5–5.2)
Alkaline Phosphatase: 82 U/L (ref 39–117)
Bilirubin, Direct: 0.1 mg/dL (ref 0.0–0.3)
Total Bilirubin: 0.7 mg/dL (ref 0.2–1.2)
Total Protein: 7.4 g/dL (ref 6.0–8.3)

## 2021-01-08 LAB — CBC WITH DIFFERENTIAL/PLATELET
Basophils Absolute: 0 10*3/uL (ref 0.0–0.1)
Basophils Relative: 0.5 % (ref 0.0–3.0)
Eosinophils Absolute: 1 10*3/uL — ABNORMAL HIGH (ref 0.0–0.7)
Eosinophils Relative: 9.4 % — ABNORMAL HIGH (ref 0.0–5.0)
HCT: 41.6 % (ref 36.0–46.0)
Hemoglobin: 13.8 g/dL (ref 12.0–15.0)
Lymphocytes Relative: 26.7 % (ref 12.0–46.0)
Lymphs Abs: 2.8 10*3/uL (ref 0.7–4.0)
MCHC: 33 g/dL (ref 30.0–36.0)
MCV: 91.3 fl (ref 78.0–100.0)
Monocytes Absolute: 0.7 10*3/uL (ref 0.1–1.0)
Monocytes Relative: 6.3 % (ref 3.0–12.0)
Neutro Abs: 5.9 10*3/uL (ref 1.4–7.7)
Neutrophils Relative %: 57.1 % (ref 43.0–77.0)
Platelets: 221 10*3/uL (ref 150.0–400.0)
RBC: 4.56 Mil/uL (ref 3.87–5.11)
RDW: 14 % (ref 11.5–15.5)
WBC: 10.4 10*3/uL (ref 4.0–10.5)

## 2021-01-08 LAB — LIPID PANEL
Cholesterol: 120 mg/dL (ref 0–200)
HDL: 45.8 mg/dL (ref >39.00)
LDL Cholesterol: 52 mg/dL (ref 0–99)
NonHDL: 74.21
Total CHOL/HDL Ratio: 3
Triglycerides: 112 mg/dL (ref 0.0–149.0)
VLDL: 22.4 mg/dL (ref 0.0–40.0)

## 2021-01-08 LAB — BASIC METABOLIC PANEL
BUN: 10 mg/dL (ref 6–23)
CO2: 28 mEq/L (ref 19–32)
Calcium: 8.9 mg/dL (ref 8.4–10.5)
Chloride: 104 mEq/L (ref 96–112)
Creatinine, Ser: 0.76 mg/dL (ref 0.40–1.20)
GFR: 94.18 mL/min (ref >60.00)
Glucose, Bld: 104 mg/dL — ABNORMAL HIGH (ref 70–99)
Potassium: 4 mEq/L (ref 3.5–5.1)
Sodium: 139 mEq/L (ref 135–145)

## 2021-01-08 LAB — MICROALBUMIN / CREATININE URINE RATIO
Creatinine,U: 235.9 mg/dL
Microalb Creat Ratio: 1.2 mg/g (ref 0.0–30.0)
Microalb, Ur: 2.9 mg/dL — ABNORMAL HIGH (ref 0.0–1.9)

## 2021-01-08 LAB — TSH: TSH: 2.07 u[IU]/mL (ref 0.35–5.50)

## 2021-01-08 LAB — HEMOGLOBIN A1C: Hgb A1c MFr Bld: 6.2 % (ref 4.6–6.5)

## 2021-01-08 NOTE — Progress Notes (Signed)
Patient ID: Colleen Wagner, female   DOB: 06-Jul-1975, 45 y.o.   MRN: [id number]   Subjective:    Patient ID: Colleen Wagner, female    DOB: 1975-06-03, 45 y.o.   MRN: [id number]  This visit occurred during the SARS-CoV-2 public health emergency.  Safety protocols were in place, including screening questions prior to the visit, additional usage of staff PPE, and extensive cleaning of exam room while observing appropriate contact time as indicated for disinfecting solutions.   Patient here for a scheduled follow up.   Chief Complaint  Patient presents with   Diabetes   Hypertension   .   Diabetes Pertinent negatives for hypoglycemia include no dizziness or headaches. Pertinent negatives for diabetes include no chest pain.  Hypertension Pertinent negatives include no chest pain, headaches, palpitations or shortness of breath.  She is doing relatively well.  Just started 24m mounjaro.  Tolerating relatively well.  Some acid reflux.  Notices at night - if she is lying on her stomach.  Takes pepcid 3 days per week.  No nausea or vomiting.  No abdominal pain.  Bowels doing well now.  Back to baseline.  Son is doing better.  Agreeable to schedule colonoscopy.  AM sugars 120s.  Discussed diet and exercise.     Past Medical History:  Diagnosis Date   Chicken pox    Diabetes mellitus without complication (HCC)    Past Surgical History:  Procedure Laterality Date   GALLBLADDER SURGERY     Family History  Problem Relation Age of Onset   Hyperlipidemia Mother    Hypertension Father    Diabetes Father    Stroke Father    Miscarriages / SKoreaSister    Arthritis Paternal Grandmother    Diabetes Paternal Grandmother    Breast cancer Neg Hx    Social History   Socioeconomic History   Marital status: Single    Spouse name: Not on file   Number of children: Not on file   Years of education: Not on file   Highest education level: Bachelor's degree (e.g., BA, AB, BS)   Occupational History   Not on file  Tobacco Use   Smoking status: Every Day    Packs/day: 0.50    Years: 25.00    Pack years: 12.50    Types: Cigarettes   Smokeless tobacco: Never   Tobacco comments:    Enjoys smoking  Vaping Use   Vaping Use: Former  Substance and Sexual Activity   Alcohol use: Yes    Comment: rarely   Drug use: Never   Sexual activity: Yes    Birth control/protection: I.U.D.  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  HENT:  Negative for congestion and sinus pressure.   Respiratory:  Negative for cough, chest tightness and shortness of breath.   Cardiovascular:  Negative for chest pain, palpitations and leg swelling.  Gastrointestinal:  Negative for abdominal pain, diarrhea, nausea and vomiting.  Acid reflux as outlined.    Genitourinary:  Negative for difficulty urinating and dysuria.  Musculoskeletal:  Negative for joint swelling and myalgias.  Skin:  Negative for color change and rash.  Neurological:  Negative for dizziness, light-headedness and headaches.  Psychiatric/Behavioral:  Negative for agitation and dysphoric mood.       Objective:     BP 128/72   Pulse 92   Temp (!) 97.1 F (36.2 C)   Resp 16   Ht '5\' 8"'  (1.727 m)   Wt 239 lb (108.4 kg)   SpO2 98%   BMI 36.34 kg/m  Wt Readings from Last 3 Encounters:  01/08/21 239 lb (108.4 kg)  10/26/20 238 lb (108 kg)  09/03/20 247 lb (112 kg)    Physical Exam Vitals reviewed.  Constitutional:      General: She is not in acute  distress.    Appearance: Normal appearance.  HENT:     Head: Normocephalic and atraumatic.     Right Ear: External ear normal.     Left Ear: External ear normal.  Eyes:     General: No scleral icterus.       Right eye: No discharge.        Left eye: No discharge.     Conjunctiva/sclera: Conjunctivae normal.  Neck:     Thyroid: No thyromegaly.  Cardiovascular:     Rate and Rhythm: Normal rate and regular rhythm.  Pulmonary:     Effort: No respiratory distress.     Breath sounds: Normal breath sounds. No wheezing.  Abdominal:     General: Bowel sounds are normal.     Palpations: Abdomen is soft.     Tenderness: There is no abdominal tenderness.  Musculoskeletal:        General: No swelling or tenderness.     Cervical back: Neck supple. No tenderness.  Lymphadenopathy:     Cervical: No cervical adenopathy.  Skin:    Findings: No erythema or rash.  Neurological:     Mental Status: She is alert.  Psychiatric:        Mood and Affect: Mood normal.        Behavior: Behavior normal.     Outpatient Encounter Medications as of 01/08/2021  Medication Sig   blood glucose meter kit and supplies Dispense based on patient and insurance preference. Use up to four times daily as directed. (FOR ICD-10 E10.9, E11.9).   famotidine (PEPCID) 20 MG tablet Take 20 mg by mouth daily.   levonorgestrel (MIRENA) 20 MCG/24HR IUD by Intrauterine route.   metFORMIN (GLUCOPHAGE XR) 500 MG 24 hr tablet Start 523m PO BID   rosuvastatin (CRESTOR) 5 MG tablet Take 1 tablet (5 mg total) by mouth daily.   tirzepatide ([facility] 5 MG/0.5ML Pen Inject 5 mg into the skin once a week.   [DISCONTINUED] tirzepatide ([facility] 2.5 MG/0.5ML Pen Inject 2.5 mg into the skin once a week.   No facility-administered encounter medications on file as of 01/08/2021.     Lab Results  Component Value Date   WBC 10.4 01/08/2021   HGB 13.8 01/08/2021   HCT 41.6 01/08/2021   PLT 221.0 01/08/2021   GLUCOSE 104 (H)  01/08/2021   CHOL 120 01/08/2021   TRIG 112.0 01/08/2021   HDL 45.80 01/08/2021   LDLCALC 52 01/08/2021   ALT 12 01/08/2021   AST 16 01/08/2021   NA 139 01/08/2021   K 4.0 01/08/2021   CL 104 01/08/2021   CREATININE 0.76 01/08/2021  BUN 10 01/08/2021   CO2 28 01/08/2021   TSH 2.07 01/08/2021   HGBA1C 6.2 01/08/2021   MICROALBUR 2.9 (H) 01/08/2021    MM 3D SCREEN BREAST BILATERAL  Result Date: 09/23/2020 CLINICAL DATA:  Screening. EXAM: DIGITAL SCREENING BILATERAL MAMMOGRAM WITH TOMOSYNTHESIS AND CAD TECHNIQUE: Bilateral screening digital craniocaudal and mediolateral oblique mammograms were obtained. Bilateral screening digital breast tomosynthesis was performed. The images were evaluated with computer-aided detection. COMPARISON:  Previous exam(s). ACR Breast Density Category a: The breast tissue is almost entirely fatty. FINDINGS: There are no findings suspicious for malignancy. IMPRESSION: No mammographic evidence of malignancy. A result letter of this screening mammogram will be mailed directly to the patient. RECOMMENDATION: Screening mammogram in one year. (Code:[id number]-B-01Y) BI-RADS CATEGORY  1: Negative. Electronically Signed   By: Margarette Canada M.D.   On: 09/23/2020 15:27      Assessment & Plan:   Problem List Items Addressed This Visit     GERD (gastroesophageal reflux disease)    Acid reflux as outlined.  Pepcid.  Follow.        Hyperlipidemia    Low cholesterol diet and exercise.  On crestor.  Follow lipid panel and liver function tests.       Relevant Orders   CBC with Differential/Platelet (Completed)   Lipid panel (Completed)   TSH (Completed)   Hepatic function panel (Completed)   Stress    Increased stress with her son's health issues.  Discussed.  Overall feels handling things relatively well.  Follow.        Type 2 diabetes mellitus without complication, without long-term current use of insulin ([place]) - Primary    On mounjaro now.  Just increased to 61m  yesterday.  Some acid reflux.  Discussed.  Wants to continue current dose for now (since just started higher dose).  Will notify if symptoms persists.  May need to decrease.       Relevant Orders   Hemoglobin A1c (Completed)   Basic metabolic panel (Completed)   Microalbumin / creatinine urine ratio (Completed)     CEinar Pheasant MD

## 2021-01-16 ENCOUNTER — Encounter: Payer: Self-pay | Admitting: Internal Medicine

## 2021-01-16 DIAGNOSIS — K219 Gastro-esophageal reflux disease without esophagitis: Secondary | ICD-10-CM | POA: Insufficient documentation

## 2021-01-16 NOTE — Assessment & Plan Note (Signed)
On mounjaro now.  Just increased to 5mg  yesterday.  Some acid reflux.  Discussed.  Wants to continue current dose for now (since just started higher dose).  Will notify if symptoms persists.  May need to decrease.

## 2021-01-16 NOTE — Assessment & Plan Note (Signed)
Acid reflux as outlined.  Pepcid.  Follow.   

## 2021-01-16 NOTE — Assessment & Plan Note (Signed)
Low cholesterol diet and exercise.  On crestor.  Follow lipid panel and liver function tests.  

## 2021-01-16 NOTE — Assessment & Plan Note (Signed)
Increased stress with her son's health issues.  Discussed.  Overall feels handling things relatively well.  Follow.   

## 2021-02-05 ENCOUNTER — Ambulatory Visit: Payer: BC Managed Care – PPO | Admitting: Pharmacist

## 2021-02-05 DIAGNOSIS — E119 Type 2 diabetes mellitus without complications: Secondary | ICD-10-CM

## 2021-02-05 MED ORDER — TIRZEPATIDE 7.5 MG/0.5ML ~~LOC~~ SOAJ: 7.5000 mg | SUBCUTANEOUS | 2 refills | Status: DC

## 2021-02-05 NOTE — Patient Instructions (Signed)
[place],   Increase Mounjaro to 7.5 mg weekly. Let me know if you need anything.   Catie Feliz Beam, PharmD

## 2021-02-05 NOTE — Chronic Care Management (AMB) (Signed)
Chronic Care Management CCM Pharmacy Note  02/05/2021 Name:  Colleen Wagner MRN:  [id number] DOB:  04/24/1975  Summary: - Tolerating regimen well  Recommendations/Changes made from today's visit: - Increase Mounjaro to 7.5 mg weekly   Subjective: Colleen Wagner is an 45 y.o. year old female who is a primary patient of Einar Pheasant, MD.  The CCM team was consulted for assistance with disease management and care coordination needs.    Engaged with patient by telephone for follow up visit for pharmacy case management and/or care coordination services.   Objective:  Medications Reviewed Today     Reviewed by De Hollingshead, RPH-CPP (Pharmacist) on 02/05/21 at 1124  Med List Status: <None>   Medication Order Taking? Sig Documenting Provider Last Dose Status Informant  blood glucose meter kit and supplies [id number]  Dispense based on patient and insurance preference. Use up to four times daily as directed. (FOR ICD-10 E10.9, E11.9). Marval Regal, NP  Active   famotidine (PEPCID) 20 MG tablet [id number] Yes Take 20 mg by mouth daily. [provider] Taking Active   levonorgestrel (MIRENA) 20 MCG/24HR IUD [id number] Yes by Intrauterine route. [provider] Taking Active   metFORMIN (GLUCOPHAGE XR) 500 MG 24 hr tablet [id number] Yes Start 567m PO BID SEinar Pheasant MD Taking Active   rosuvastatin (CRESTOR) 5 MG tablet [id number] Take 1 tablet (5 mg total) by mouth daily. SEinar Pheasant MD Taking Active   tirzepatide ([facility] 5 MG/0.5ML Pen [id number] Inject 5 mg into the skin once a week. SEinar Pheasant MD Taking Active             Pertinent Labs:   Lab Results  Component Value Date   HGBA1C 6.2 01/08/2021   Lab Results  Component Value Date   CHOL 120 01/08/2021   HDL 45.80 01/08/2021   LDLCALC 52 01/08/2021   TRIG 112.0 01/08/2021   CHOLHDL 3 01/08/2021   Lab Results  Component Value Date   CREATININE 0.76 01/08/2021    BUN 10 01/08/2021   NA 139 01/08/2021   K 4.0 01/08/2021   CL 104 01/08/2021   CO2 28 01/08/2021    SDOH:  (Social Determinants of Health) assessments and interventions performed:  SDOH Interventions    Flowsheet Row Most Recent Value  SDOH Interventions   Financial Strain Interventions Intervention Not Indicated       CCM Care Plan  Review of patient past medical history, allergies, medications, health status, including review of consultants reports, laboratory and other test data, was performed as part of comprehensive evaluation and provision of chronic care management services.   Care Plan : Medication Management  Updates made by TDe Hollingshead RPH-CPP since 02/05/2021 12:00 AM     Problem: Diabetes, HLD, Obesity      Long-Range Goal: Disease Progression Prevention   Start Date: 07/16/2020  Recent Progress: On track  Priority: High  Note:   Current Barriers:  Unable to achieve control of weight   Pharmacist Clinical Goal(s):  Over the next 90 days, patient will achieve improvement in weight through collaboration with PharmD and provider.   Interventions: 1:1 collaboration with SEinar Pheasant MD regarding development and update of comprehensive plan of care as evidenced by provider attestation and co-signature Inter-disciplinary care team collaboration (see longitudinal plan of care) Comprehensive medication review performed; medication list updated in electronic medical record  Diabetes with Obesity: Controlled per A1c, current treatment: metformin XR 500 mg BID, Mounjaro 5  mg weekly  Denies any GI upset, reduced acid reflux than previously Baseline weight: 247 lbs; most recent weight: 238 lbs Discussed Mounjaro titration to target weight loss. Patient amenable. Increase Mounjaro to 7.5 mg weekly. Continue metformin XR 500 mg BID.   Hyperlipidemia: Controlled per last lab; current treatment: rosuvastatin 5 mg daily Previously recommended to continue  current regimen at this time  Contraception: Appropriately managed; current regimen: Mirena IUD Previously recommended to continue current regimen at this time.    Patient Goals/Self-Care Activities Over the next 90 days, patient will:  - take medications as prescribed engage in dietary modifications by reducing consumption of trigger foods      Plan: Video visit scheduled with patient in 6 weeks  Catie Darnelle Maffucci, PharmD, [place], [place] Pharmacist [employer] at [employer] [phone]

## 2021-02-16 ENCOUNTER — Encounter: Payer: Self-pay | Admitting: Physician Assistant

## 2021-02-16 ENCOUNTER — Telehealth: Payer: BC Managed Care – PPO | Admitting: Physician Assistant

## 2021-02-16 DIAGNOSIS — R6889 Other general symptoms and signs: Secondary | ICD-10-CM | POA: Diagnosis not present

## 2021-02-16 MED ORDER — OSELTAMIVIR PHOSPHATE 75 MG PO CAPS: 75.0000 mg | ORAL_CAPSULE | Freq: Two times a day (BID) | ORAL | 0 refills | Status: DC

## 2021-02-16 NOTE — Progress Notes (Signed)
Virtual Visit Consent   Colleen Wagner, you are scheduled for a virtual visit with a [facility] provider today.     Just as with appointments in the office, your consent must be obtained to participate.  Your consent will be active for this visit and any virtual visit you may have with one of our providers in the next 365 days.     If you have a MyChart account, a copy of this consent can be sent to you electronically.  All virtual visits are billed to your insurance company just like a traditional visit in the office.    As this is a virtual visit, video technology does not allow for your provider to perform a traditional examination.  This may limit your provider's ability to fully assess your condition.  If your provider identifies any concerns that need to be evaluated in person or the need to arrange testing (such as labs, EKG, etc.), we will make arrangements to do so.     Although advances in technology are sophisticated, we cannot ensure that it will always work on either your end or our end.  If the connection with a video visit is poor, the visit may have to be switched to a telephone visit.  With either a video or telephone visit, we are not always able to ensure that we have a secure connection.     I need to obtain your verbal consent now.   Are you willing to proceed with your visit today?    Colleen Wagner has provided verbal consent on 02/16/2021 for a virtual visit (video or telephone).   Colleen Wagner, [place]   Date: 02/16/2021 4:30 PM   Virtual Visit via Video Note   I, Colleen Wagner, connected with  Colleen Wagner  ([id number], June 15, 1975) on 02/16/21 at  4:20 PM EST by a video-enabled telemedicine application and verified that I am speaking with the correct person using two identifiers.  Location: Patient: Virtual Visit Location Patient: Home Provider: Virtual Visit Location Provider: Home Office   I discussed the limitations of evaluation  and management by telemedicine and the availability of in person appointments. The patient expressed understanding and agreed to proceed.    History of Present Illness: Colleen Wagner is a 45 y.o. who identifies as a female who was assigned female at birth, and is being seen today for abrupt onset of URI symptoms. Endorses waking up yesterday with sinus drainage, voice hoarseness, scratchy throat, worsening today. Now with chills, aches and fever (Tmax 100.7). Denies chest pain or SOB. Has taken OTC Tylenol to help with fever and aches.  Has not taken a home COVID test.     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 Use up to four times daily as directed. (FOR ICD-10 E10.9, E11.9)., Disp: 1 each, Rfl: 0   famotidine (PEPCID) 20 MG tablet, Take 20 mg by mouth daily., Disp: , Rfl:    levonorgestrel (MIRENA) 20 MCG/24HR IUD, by Intrauterine route., Disp: , Rfl:    metFORMIN (GLUCOPHAGE XR) 500 MG 24 hr tablet, Start 542m PO BID, Disp: 180 tablet, Rfl: 1   rosuvastatin (CRESTOR) 5 MG tablet, Take 1 tablet (5 mg total) by mouth daily., Disp:  90 tablet, Rfl: 0   tirzepatide (MOUNJARO) 7.5 MG/0.5ML Pen, Inject 7.5 mg into the skin once a week., Disp: 2 mL, Rfl: 2  Observations/Objective: Patient is well-developed, well-nourished in no acute distress.  Resting comfortably in parked car Head is normocephalic, atraumatic.  No labored breathing. Speech is clear and coherent with logical content.  Patient is alert and oriented at baseline.   Assessment and Plan: 1. Flu-like symptoms - oseltamivir (TAMIFLU) 75 MG capsule; Take 1 capsule (75 mg total) by mouth 2 (two) times daily.  Dispense: 10 capsule; Refill: 0 Classic symptoms. Will have her take home COVID test just to be overly cautious. Pending negative result, will start Tamiflu. Supportive measures, vitamin recommendations and OTC medications reviewed in detail. Quarantine reviewed. Patient declined work note at present time.   Follow Up Instructions: I discussed the assessment and treatment plan with the patient. The patient was provided an opportunity to ask questions and all were answered. The patient agreed with the plan and demonstrated an understanding of the instructions.  A copy of instructions were sent to the patient via MyChart unless otherwise noted below.   The patient was advised to call back or seek an in-person evaluation if the symptoms worsen or if the condition fails to improve as anticipated.  Time:  I spent 12 minutes with the patient via telehealth technology discussing the above problems/concerns.    WLeeanne Rio PA-C

## 2021-02-16 NOTE — Patient Instructions (Signed)
Ilana J Bilbo, thank you for joining William Cody Martin, PA-C for today's virtual visit.  While this provider is not your primary care provider (PCP), if your PCP is located in our provider database this encounter information will be shared with them immediately following your visit. ° °Consent: °(Patient) Colleen Wagner provided verbal consent for this virtual visit at the beginning of the encounter. ° °Current Medications: ° °Current Outpatient Medications:  °  blood glucose meter kit and supplies, Dispense based on patient and insurance preference. Use up to four times daily as directed. (FOR ICD-10 E10.9, E11.9)., Disp: 1 each, Rfl: 0 °  famotidine (PEPCID) 20 MG tablet, Take 20 mg by mouth daily., Disp: , Rfl:  °  levonorgestrel (MIRENA) 20 MCG/24HR IUD, by Intrauterine route., Disp: , Rfl:  °  metFORMIN (GLUCOPHAGE XR) 500 MG 24 hr tablet, Start 500mg PO BID, Disp: 180 tablet, Rfl: 1 °  rosuvastatin (CRESTOR) 5 MG tablet, Take 1 tablet (5 mg total) by mouth daily., Disp: 90 tablet, Rfl: 0 °  tirzepatide (MOUNJARO) 7.5 MG/0.5ML Pen, Inject 7.5 mg into the skin once a week., Disp: 2 mL, Rfl: 2  ° °Medications ordered in this encounter:  °No orders of the defined types were placed in this encounter. °  ° °*If you need refills on other medications prior to your next appointment, please contact your pharmacy* ° °Follow-Up: °Call back or seek an in-person evaluation if the symptoms worsen or if the condition fails to improve as anticipated. ° °Other Instructions °Please take a home COVID test as directed just to be cautious. °Let me know if positive as discussed. ° °Otherwise, again this really seems consistent with influenza.  °Keep well-hydrated and get plenty of rest. °Alternate tylenol and ibuprofen if needed for fever, aches, headache or sore throat. °Start a saline nasal rinse and consider using an OTC nasal steroid like Flonase or Nasacort. °You can start Theraflu OTC. °Take the Tamiflu I have  sent in (antiviral) as directed. ° °Keep at home until fever-free for 24 hours without medication and feeling consistently better. ° ° °If you have been instructed to have an in-person evaluation today at a local Urgent Care facility, please use the link below. It will take you to a list of all of our available [facility], including address, phone number and hours of operation. Please do not delay care.  °[facility] ° °If you or a family member do not have a primary care provider, use the link below to schedule a visit and establish care. When you choose a [facility] primary care physician or advanced practice provider, you gain a long-term partner in health. °Find a Primary Care Provider ° °Learn more about [facility] in-office and virtual care options: °[facility] - Get Care Now  °

## 2021-03-19 ENCOUNTER — Telehealth: Payer: Self-pay | Admitting: Pharmacist

## 2021-03-19 ENCOUNTER — Telehealth: Payer: BC Managed Care – PPO

## 2021-03-19 NOTE — Telephone Encounter (Signed)
°  Pharmacy Note  03/19/2021 Name: Colleen Wagner MRN: [id number] DOB: 04/22/1975   Attempted to contact patient for scheduled appointment for medication management support. Left HIPAA compliant message for patient to return my call at their convenience.    Plan: - If I do not hear back from the patient by end of business today, will collaborate with Care Guide to outreach to schedule follow up with me   Catie Feliz Beam, PharmD, [place], CPP Clinical Pharmacist [employer] at [employer] [phone]

## 2021-03-22 ENCOUNTER — Other Ambulatory Visit: Payer: Self-pay | Admitting: Internal Medicine

## 2021-03-26 NOTE — Telephone Encounter (Signed)
Pt has been rescheduled. 

## 2021-04-05 ENCOUNTER — Other Ambulatory Visit: Payer: Self-pay | Admitting: Internal Medicine

## 2021-04-27 ENCOUNTER — Telehealth: Payer: BC Managed Care – PPO | Admitting: Pharmacist

## 2021-04-27 VITALS — Wt 224.0 lb

## 2021-04-27 DIAGNOSIS — E785 Hyperlipidemia, unspecified: Secondary | ICD-10-CM | POA: Diagnosis not present

## 2021-04-27 DIAGNOSIS — E119 Type 2 diabetes mellitus without complications: Secondary | ICD-10-CM | POA: Diagnosis not present

## 2021-04-27 MED ORDER — TIRZEPATIDE 7.5 MG/0.5ML ~~LOC~~ SOAJ: 7.5000 mg | SUBCUTANEOUS | 1 refills | Status: DC

## 2021-04-27 NOTE — Patient Instructions (Signed)
[place],   Keep up the great work! Let me know if there are any medication related issues moving forward.   Take care,   Catie Feliz Beam, PharmD

## 2021-04-27 NOTE — Progress Notes (Signed)
Chief Complaint  Patient presents with   Diabetes    Colleen Wagner is a 46 y.o. year old female who was referred for medication management by their primary care provider, Einar Pheasant, MD. They presented for a virtual visit in the context of the COVID-19 pandemic.   I connected with Kiarrah on 04/27/21 at 11:25 am by video and verified that I am speaking with the correct person using two identifiers.   I discussed the limitations, risks, security and privacy concerns of performing an evaluation and management service by video and the availability of in person appointments. I also discussed with the patient that there may be a patient responsible charge related to this service. The patient expressed understanding and agreed to proceed.   Patient location:  home My Location:  work Persons on the video call:  patient and myself   Subjective: Diabetes:  Current medications: metformin XR 500 mg twice daily, Mounjaro 7.5 mg weekly  Current glucose readings: fastings ~ 90s; post prandial ~ 110s  Reports occasional acid reflux, exacerbated on Mounjaro, but only periodic. Using famotidine PRN with benefit.   Objective:  Vitals with BMI 04/27/2021 01/08/2021 10/26/2020  Height - _0  -  Weight 224 lbs 239 lbs 238 lbs  BMI - 10.62 -  Systolic - 694 -  Diastolic - 72 -  Pulse - 92 -     Lab Results  Component Value Date   HGBA1C 6.2 01/08/2021    Lab Results  Component Value Date   CREATININE 0.76 01/08/2021   BUN 10 01/08/2021   NA 139 01/08/2021   K 4.0 01/08/2021   CL 104 01/08/2021   CO2 28 01/08/2021    Lab Results  Component Value Date   CHOL 120 01/08/2021   HDL 45.80 01/08/2021   LDLCALC 52 01/08/2021   TRIG 112.0 01/08/2021   CHOLHDL 3 01/08/2021    Medications Reviewed Today     Reviewed by De Hollingshead, RPH-CPP (Pharmacist) on 04/27/21 at 1125  Med List Status: <None>   Medication Order Taking? Sig Documenting Provider Last Dose  Status Informant  blood glucose meter kit and supplies [id number]  Dispense based on patient and insurance preference. Use up to four times daily as directed. (FOR ICD-10 E10.9, E11.9). Marval Regal, NP  Active   famotidine (PEPCID) 20 MG tablet [id number] Yes Take 20 mg by mouth daily. [provider] Taking Active   levonorgestrel (MIRENA) 20 MCG/24HR IUD [id number] Yes by Intrauterine route. [provider] Taking Active   metFORMIN (GLUCOPHAGE-XR) 500 MG 24 hr tablet [id number] Yes TAKE 1 TABLET TWICE A DAY Scott, Charlene, MD Taking Active   rosuvastatin (CRESTOR) 5 MG tablet [id number] Yes Take 1 tablet (5 mg total) by mouth daily. Einar Pheasant, MD Taking Active   tirzepatide [facility]) 7.5 MG/0.5ML Pen [id number] Yes Inject 7.5 mg into the skin once a week. Einar Pheasant, MD Taking Active             Assessment/Plan:   Diabetes: - Currently controlled and tolerating medication regimen - Recommend to continue current regimen at this time - Scheduled fasting labs next week prior to PCP visit.   Follow Up Plan: no need for Pharmacy follow up at this time  Maharishi [place], PharmD, [place], CPP Clinical Pharmacist [employer] at [employer] [phone]

## 2021-05-07 ENCOUNTER — Other Ambulatory Visit: Payer: Self-pay

## 2021-05-07 ENCOUNTER — Other Ambulatory Visit (INDEPENDENT_AMBULATORY_CARE_PROVIDER_SITE_OTHER): Payer: BC Managed Care – PPO

## 2021-05-07 DIAGNOSIS — E785 Hyperlipidemia, unspecified: Secondary | ICD-10-CM

## 2021-05-07 DIAGNOSIS — E119 Type 2 diabetes mellitus without complications: Secondary | ICD-10-CM | POA: Diagnosis not present

## 2021-05-07 LAB — LIPID PANEL
Cholesterol: 123 mg/dL (ref 0–200)
HDL: 47.4 mg/dL (ref >39.00)
LDL Cholesterol: 38 mg/dL (ref 0–99)
NonHDL: 75.82
Total CHOL/HDL Ratio: 3
Triglycerides: 187 mg/dL — ABNORMAL HIGH (ref 0.0–149.0)
VLDL: 37.4 mg/dL (ref 0.0–40.0)

## 2021-05-07 LAB — COMPREHENSIVE METABOLIC PANEL
ALT: 10 U/L (ref 0–35)
AST: 15 U/L (ref 0–37)
Albumin: 4.4 g/dL (ref 3.5–5.2)
Alkaline Phosphatase: 72 U/L (ref 39–117)
BUN: 9 mg/dL (ref 6–23)
CO2: 29 mEq/L (ref 19–32)
Calcium: 9.2 mg/dL (ref 8.4–10.5)
Chloride: 104 mEq/L (ref 96–112)
Creatinine, Ser: 0.69 mg/dL (ref 0.40–1.20)
GFR: 104.07 mL/min (ref >60.00)
Glucose, Bld: 104 mg/dL — ABNORMAL HIGH (ref 70–99)
Potassium: 4.1 mEq/L (ref 3.5–5.1)
Sodium: 140 mEq/L (ref 135–145)
Total Bilirubin: 0.4 mg/dL (ref 0.2–1.2)
Total Protein: 7.4 g/dL (ref 6.0–8.3)

## 2021-05-07 LAB — HEMOGLOBIN A1C: Hgb A1c MFr Bld: 6 % (ref 4.6–6.5)

## 2021-05-10 ENCOUNTER — Other Ambulatory Visit: Payer: Self-pay

## 2021-05-10 ENCOUNTER — Ambulatory Visit: Payer: BC Managed Care – PPO | Admitting: Internal Medicine

## 2021-05-10 VITALS — BP 120/72 | HR 65 | Temp 97.8°F | Resp 16 | Ht 65.0 in | Wt 222.0 lb

## 2021-05-10 DIAGNOSIS — R008 Other abnormalities of heart beat: Secondary | ICD-10-CM

## 2021-05-10 DIAGNOSIS — E785 Hyperlipidemia, unspecified: Secondary | ICD-10-CM | POA: Diagnosis not present

## 2021-05-10 DIAGNOSIS — K219 Gastro-esophageal reflux disease without esophagitis: Secondary | ICD-10-CM | POA: Diagnosis not present

## 2021-05-10 DIAGNOSIS — Z1211 Encounter for screening for malignant neoplasm of colon: Secondary | ICD-10-CM

## 2021-05-10 DIAGNOSIS — I499 Cardiac arrhythmia, unspecified: Secondary | ICD-10-CM | POA: Diagnosis not present

## 2021-05-10 DIAGNOSIS — E119 Type 2 diabetes mellitus without complications: Secondary | ICD-10-CM | POA: Diagnosis not present

## 2021-05-10 DIAGNOSIS — F439 Reaction to severe stress, unspecified: Secondary | ICD-10-CM

## 2021-05-10 MED ORDER — PEG 3350-KCL-NA BICARB-NACL 420 G PO SOLR: 4000.0000 mL | Freq: Once | ORAL | 0 refills | Status: AC

## 2021-05-10 NOTE — Progress Notes (Signed)
Gastroenterology Pre-Procedure Review ? ?Request Date:  ?06/03/2021 ?Requesting Physician: Dr. Marius Ditch ? ? ?PATIENT REVIEW QUESTIONS: The patient responded to the following health history questions as indicated:   ? ?1. Are you having any GI issues? no ?2. Do you have a personal history of Polyps? no ?3. Do you have a family history of Colon Cancer or Polyps? no ?4. Diabetes Mellitus? yes (type 2 ) ?5. Joint replacements in the past 12 months?no ?6. Major health problems in the past 3 months?no ?7. Any artificial heart valves, MVP, or defibrillator?no ?   ?MEDICATIONS & ALLERGIES:    ?Patient reports the following regarding taking any anticoagulation/antiplatelet therapy:   ?Plavix, Coumadin, Eliquis, Xarelto, Lovenox, Pradaxa, Brilinta, or Effient? no ?Aspirin? no ? ?Patient confirms/reports the following medications:  ?Current Outpatient Medications  ?Medication Sig Dispense Refill  ? blood glucose meter kit and supplies Dispense based on patient and insurance preference. Use up to four times daily as directed. (FOR ICD-10 E10.9, E11.9). 1 each 0  ? famotidine (PEPCID) 20 MG tablet Take 20 mg by mouth daily.    ? levonorgestrel (MIRENA) 20 MCG/24HR IUD by Intrauterine route.    ? metFORMIN (GLUCOPHAGE-XR) 500 MG 24 hr tablet TAKE 1 TABLET TWICE A DAY 180 tablet 1  ? rosuvastatin (CRESTOR) 5 MG tablet Take 1 tablet (5 mg total) by mouth daily. 90 tablet 0  ? tirzepatide (MOUNJARO) 7.5 MG/0.5ML Pen Inject 7.5 mg into the skin once a week. 6 mL 1  ? ?No current facility-administered medications for this visit.  ? ? ?Patient confirms/reports the following allergies:  ?No Known Allergies ? ?No orders of the defined types were placed in this encounter. ? ? ?AUTHORIZATION INFORMATION ?Primary Insurance: ?1D#: ?Group #: ? ?Secondary Insurance: ?1D#: ?Group #: ? ?SCHEDULE INFORMATION: ?Date: 06/03/2021 ?Time: ?Location:[facility] ? ?

## 2021-05-10 NOTE — Progress Notes (Signed)
Patient ID: Colleen Wagner, female   DOB: [birth year], 46 y.o.   MRN: [id number]   Subjective:    Patient ID: Colleen Wagner, female    DOB: 05-Jun-1975, 46 y.o.   MRN: [id number]  This visit occurred during the SARS-CoV-2 public health emergency.  Safety protocols were in place, including screening questions prior to the visit, additional usage of staff PPE, and extensive cleaning of exam room while observing appropriate contact time as indicated for disinfecting solutions.   Patient here for a scheduled follow up.   Chief Complaint  Patient presents with   Hyperlipidemia   Diabetes   Gastroesophageal Reflux   .   HPI Here to follow up regarding diabetes, stress and hypertension.  On metfromin and mounjaro.  Doing well with this regimen.  She is doing intermittent fasting.  Losing weight.  Feels better.  Some acid reflux - occasional.  Tkes pepcid prn. Does not feel this is a significant issue.  Eating.  No nausea or vomiting.  Increased stress with her son's health issues, but she feels she is doing better.  He is continuing to improve.  He recently had surgery - hands.  No chest pain or sob reported.  No increased heart rate or palpitations.  No abdominal pain.  Bowels moving.  Agreeable for referral for colonoscopy.     Past Medical History:  Diagnosis Date   Chicken pox    Diabetes mellitus without complication (HCC)    Past Surgical History:  Procedure Laterality Date   GALLBLADDER SURGERY     Family History  Problem Relation Age of Onset   Hyperlipidemia Mother    Hypertension Father    Diabetes Father    Stroke Father    Miscarriages / [place] Sister    Arthritis Paternal Grandmother    Diabetes Paternal Grandmother    Breast cancer Neg Hx    Social History   Socioeconomic History   Marital status: Single    Spouse name: Not on file   Number of children: Not on file   Years of education: Not on file   Highest education level: Bachelor's degree (e.g.,  BA, AB, BS)  Occupational History   Not on file  Tobacco Use   Smoking status: Every Day    Packs/day: 0.50    Years: 25.00    Pack years: 12.50    Types: Cigarettes   Smokeless tobacco: Never   Tobacco comments:    Enjoys smoking  Vaping Use   Vaping Use: Former  Substance and Sexual Activity   Alcohol use: Yes    Comment: rarely   Drug use: Never   Sexual activity: Yes    Birth control/protection: I.U.D.  Other Topics Concern   Not on file  Social History Narrative   No married with 2 sons and works [employer], historic buildings   Social Determinants of Radio broadcast assistant Strain: Low Risk    Difficulty of Paying Living Expenses: Not hard at all  Food Insecurity: Not on file  Transportation Needs: Not on file  Physical Activity: Not on file  Stress: Not on file  Social Connections: Not on file     Review of Systems  Constitutional:  Negative for appetite change and unexpected weight change.  HENT:  Negative for congestion and sinus pressure.   Respiratory:  Negative for cough, chest tightness and shortness of breath.   Cardiovascular:  Negative for chest pain, palpitations and leg swelling.  Gastrointestinal:  Negative for  abdominal pain, diarrhea, nausea and vomiting.  Genitourinary:  Negative for difficulty urinating and dysuria.  Musculoskeletal:  Negative for joint swelling and myalgias.  Skin:  Negative for color change and rash.  Neurological:  Negative for dizziness, light-headedness and headaches.  Psychiatric/Behavioral:  Negative for agitation and dysphoric mood.       Objective:     BP 120/72    Pulse 65    Temp 97.8 F (36.6 C)    Resp 16    Ht _0  (1.651 m)    Wt 222 lb (100.7 kg)    SpO2 98%    BMI 36.94 kg/m  Wt Readings from Last 3 Encounters:  05/10/21 222 lb (100.7 kg)  04/27/21 224 lb (101.6 kg)  01/08/21 239 lb (108.4 kg)    Physical Exam Vitals reviewed.  Constitutional:      General: She is not in acute distress.    Appearance:  Normal appearance.  HENT:     Head: Normocephalic and atraumatic.     Right Ear: External ear normal.     Left Ear: External ear normal.  Eyes:     General: No scleral icterus.       Right eye: No discharge.        Left eye: No discharge.     Conjunctiva/sclera: Conjunctivae normal.  Neck:     Thyroid: No thyromegaly.  Cardiovascular:     Rate and Rhythm: Normal rate and regular rhythm.     Comments: Appears to have regular rhythm with premature beats.  Pulmonary:     Effort: No respiratory distress.     Breath sounds: Normal breath sounds. No wheezing.  Abdominal:     General: Bowel sounds are normal.     Palpations: Abdomen is soft.     Tenderness: There is no abdominal tenderness.  Musculoskeletal:        General: No swelling or tenderness.     Cervical back: Neck supple. No tenderness.  Lymphadenopathy:     Cervical: No cervical adenopathy.  Skin:    Findings: No erythema or rash.  Neurological:     Mental Status: She is alert.  Psychiatric:        Mood and Affect: Mood normal.        Behavior: Behavior normal.     Outpatient Encounter Medications as of 05/10/2021  Medication Sig   blood glucose meter kit and supplies Dispense based on patient and insurance preference. Use up to four times daily as directed. (FOR ICD-10 E10.9, E11.9).   famotidine (PEPCID) 20 MG tablet Take 20 mg by mouth daily.   levonorgestrel (MIRENA) 20 MCG/24HR IUD by Intrauterine route.   metFORMIN (GLUCOPHAGE-XR) 500 MG 24 hr tablet TAKE 1 TABLET TWICE A DAY   rosuvastatin (CRESTOR) 5 MG tablet Take 1 tablet (5 mg total) by mouth daily.   tirzepatide (MOUNJARO) 7.5 MG/0.5ML Pen Inject 7.5 mg into the skin once a week.   No facility-administered encounter medications on file as of 05/10/2021.     Lab Results  Component Value Date   WBC 10.4 01/08/2021   HGB 13.8 01/08/2021   HCT 41.6 01/08/2021   PLT 221.0 01/08/2021   GLUCOSE 104 (H) 05/07/2021   CHOL 123 05/07/2021   TRIG 187.0 (H)  05/07/2021   HDL 47.40 05/07/2021   LDLCALC 38 05/07/2021   ALT 10 05/07/2021   AST 15 05/07/2021   NA 140 05/07/2021   K 4.1 05/07/2021   CL 104 05/07/2021   CREATININE  0.69 05/07/2021   BUN 9 05/07/2021   CO2 29 05/07/2021   TSH 2.07 01/08/2021   HGBA1C 6.0 05/07/2021   MICROALBUR 2.9 (H) 01/08/2021    MM 3D SCREEN BREAST BILATERAL  Result Date: 09/23/2020 CLINICAL DATA:  Screening. EXAM: DIGITAL SCREENING BILATERAL MAMMOGRAM WITH TOMOSYNTHESIS AND CAD TECHNIQUE: Bilateral screening digital craniocaudal and mediolateral oblique mammograms were obtained. Bilateral screening digital breast tomosynthesis was performed. The images were evaluated with computer-aided detection. COMPARISON:  Previous exam(s). ACR Breast Density Category a: The breast tissue is almost entirely fatty. FINDINGS: There are no findings suspicious for malignancy. IMPRESSION: No mammographic evidence of malignancy. A result letter of this screening mammogram will be mailed directly to the patient. RECOMMENDATION: Screening mammogram in one year. (Code:SM-B-01Y) BI-RADS CATEGORY  1: Negative. Electronically Signed   By: Margarette Canada M.D.   On: 09/23/2020 15:27      Assessment & Plan:   Problem List Items Addressed This Visit     Colon cancer screening - Primary    Age 30.  Due colonoscopy.  Refer to GI.       Relevant Orders   Ambulatory referral to Gastroenterology   GERD (gastroesophageal reflux disease)    No significant acid reflux.  Takes pepcid prn.  Will notify me if becomes more of a problem.  Follow.       Hyperlipidemia    Low cholesterol diet and exercise.  On crestor.  Follow lipid panel and liver function tests.       Relevant Orders   Lipid panel   Hepatic function panel   Basic metabolic panel   Irregular heart rhythm    Noted on exam.  No chest pain, sob, palpitations, increased heart rate.  EKG - SR with ventricular trigeminy.  Given increased PVCs, will have cardiology evaluate with  question of need for any further cardiac testing. (i.e. , monitor, echo, calcium score, etc).        Relevant Orders   EKG 12-Lead (Completed)   Stress    Increased stress with her son's health issues.  Discussed.  Overall feels handling things relatively well.  Follow.        Type 2 diabetes mellitus with hyperglycemia (HCC)    On mounjaro - 7.31m Some acid reflux.  Discussed.  Not a significant issue for her.  Has done well with diet and exercise.  Has lost weight.  Follow met b and a1c.        Other Visit Diagnoses     Trigeminy       Relevant Orders   Ambulatory referral to Cardiology        CEinar Pheasant MD

## 2021-05-11 ENCOUNTER — Encounter: Payer: Self-pay | Admitting: Internal Medicine

## 2021-05-11 ENCOUNTER — Ambulatory Visit: Payer: Self-pay | Admitting: Pharmacist

## 2021-05-11 NOTE — Assessment & Plan Note (Signed)
Low cholesterol diet and exercise.  On crestor.  Follow lipid panel and liver function tests.  

## 2021-05-11 NOTE — Assessment & Plan Note (Signed)
Increased stress with her son's health issues.  Discussed.  Overall feels handling things relatively well.  Follow.   

## 2021-05-11 NOTE — Assessment & Plan Note (Signed)
On mounjaro - 7.34m Some acid reflux.  Discussed.  Not a significant issue for her.  Has done well with diet and exercise.  Has lost weight.  Follow met b and a1c.   ?

## 2021-05-11 NOTE — Assessment & Plan Note (Signed)
Noted on exam.  No chest pain, sob, palpitations, increased heart rate.  EKG - SR with ventricular trigeminy.  Given increased PVCs, will have cardiology evaluate with question of need for any further cardiac testing. (i.e. , monitor, echo, calcium score, etc).   ?

## 2021-05-11 NOTE — Assessment & Plan Note (Signed)
Age 45. Due colonoscopy.  Refer to GI.  

## 2021-05-11 NOTE — Chronic Care Management (AMB) (Signed)
?  Chronic Care Management  ? ?Note ? ?05/11/2021 ?Name: Via Rosado MRN: [id number] DOB: 10-22-1975 ? ? ? ?Closing pharmacy CCM case at this time.  Patient has clinic contact information for future questions or concerns.  ? ?Catie Feliz Beam, PharmD, [place], CPP ?Clinical Pharmacist ?Nature conservation officer at [employer] ?[phone] ? ?

## 2021-05-11 NOTE — Assessment & Plan Note (Signed)
No significant acid reflux.  Takes pepcid prn.  Will notify me if becomes more of a problem.  Follow.  ?

## 2021-06-03 ENCOUNTER — Encounter: Payer: Self-pay | Admitting: Gastroenterology

## 2021-06-03 ENCOUNTER — Encounter
Admission: RE | Disposition: A | Discharge status: Home / Self Care | Payer: Self-pay | Source: Home / Self Care | Attending: Gastroenterology

## 2021-06-03 ENCOUNTER — Ambulatory Visit: Payer: BC Managed Care – PPO | Admitting: Registered Nurse

## 2021-06-03 ENCOUNTER — Other Ambulatory Visit: Payer: Self-pay

## 2021-06-03 ENCOUNTER — Ambulatory Visit
Admission: RE | Admit: 2021-06-03 | Discharge: 2021-06-03 | Disposition: A | Discharge status: Home / Self Care | Payer: BC Managed Care – PPO | Attending: Gastroenterology | Admitting: Gastroenterology

## 2021-06-03 DIAGNOSIS — Z87891 Personal history of nicotine dependence: Secondary | ICD-10-CM | POA: Diagnosis not present

## 2021-06-03 DIAGNOSIS — Z1211 Encounter for screening for malignant neoplasm of colon: Secondary | ICD-10-CM | POA: Insufficient documentation

## 2021-06-03 DIAGNOSIS — E119 Type 2 diabetes mellitus without complications: Secondary | ICD-10-CM | POA: Insufficient documentation

## 2021-06-03 DIAGNOSIS — E1165 Type 2 diabetes mellitus with hyperglycemia: Secondary | ICD-10-CM

## 2021-06-03 HISTORY — PX: COLONOSCOPY WITH PROPOFOL: SHX5780

## 2021-06-03 LAB — GLUCOSE, CAPILLARY: Glucose-Capillary: 96 mg/dL (ref 70–99)

## 2021-06-03 LAB — POCT PREGNANCY, URINE: Preg Test, Ur: NEGATIVE

## 2021-06-03 SURGERY — COLONOSCOPY WITH PROPOFOL
Anesthesia: General

## 2021-06-03 MED ORDER — PROPOFOL 10 MG/ML IV BOLUS
INTRAVENOUS | Status: DC | PRN
Start: 2021-06-03 — End: 2021-06-03
  Administered 2021-06-03: 100 mg via INTRAVENOUS

## 2021-06-03 MED ORDER — SODIUM CHLORIDE 0.9 % IV SOLN: INTRAVENOUS | Status: DC

## 2021-06-03 MED ORDER — LIDOCAINE HCL (CARDIAC) PF 100 MG/5ML IV SOSY
PREFILLED_SYRINGE | INTRAVENOUS | Status: DC | PRN
  Administered 2021-06-03: 100 mg via INTRAVENOUS

## 2021-06-03 MED ORDER — PROPOFOL 500 MG/50ML IV EMUL
INTRAVENOUS | Status: DC | PRN
  Administered 2021-06-03: 150 ug/kg/min via INTRAVENOUS

## 2021-06-03 NOTE — Op Note (Signed)
[facility] ?Gastroenterology ?Patient Name: Sara Keys [facility] ?Procedure Date: 06/03/2021 8:00 AM ?MRN: [id number] ?Account #: [id number] ?Date of Birth: December 04, 1975 ?Admit Type: Outpatient ?Age: 46 ?Room: [facility] ENDO ROOM 4 ?Gender: Female ?Note Status: Finalized ?Instrument Name: Colonoscope [id number] ?Procedure:             Colonoscopy ?Indications:           Screening for colorectal malignant neoplasm, This is  ?                       the patient's first colonoscopy ?Providers:             Toney Reil MD, MD ?Referring MD:          Dale Mitchell, MD (Referring MD) ?Medicines:             General Anesthesia ?Complications:         No immediate complications. Estimated blood loss: None. ?Procedure:             Pre-Anesthesia Assessment: ?                       - Prior to the procedure, a History and Physical was  ?                       performed, and patient medications and allergies were  ?                       reviewed. The patient is competent. The risks and  ?                       benefits of the procedure and the sedation options and  ?                       risks were discussed with the patient. All questions  ?                       were answered and informed consent was obtained.  ?                       Patient identification and proposed procedure were  ?                       verified by the physician, the nurse, the  ?                       anesthesiologist, the anesthetist and the technician  ?                       in the pre-procedure area in the procedure room in the  ?                       endoscopy suite. Mental Status Examination: alert and  ?                       oriented. Airway Examination: normal oropharyngeal  ?                       airway and neck mobility. Respiratory Examination:  ?  clear to auscultation. CV Examination: normal.  ?                       Prophylactic Antibiotics: The patient does not require  ?                        prophylactic antibiotics. Prior Anticoagulants: The  ?                       patient has taken no previous anticoagulant or  ?                       antiplatelet agents. ASA Grade Assessment: II - A  ?                       patient with mild systemic disease. After reviewing  ?                       the risks and benefits, the patient was deemed in  ?                       satisfactory condition to undergo the procedure. The  ?                       anesthesia plan was to use general anesthesia.  ?                       Immediately prior to administration of medications,  ?                       the patient was re-assessed for adequacy to receive  ?                       sedatives. The heart rate, respiratory rate, oxygen  ?                       saturations, blood pressure, adequacy of pulmonary  ?                       ventilation, and response to care were monitored  ?                       throughout the procedure. The physical status of the  ?                       patient was re-assessed after the procedure. ?                       After obtaining informed consent, the colonoscope was  ?                       passed under direct vision. Throughout the procedure,  ?                       the patient's blood pressure, pulse, and oxygen  ?                       saturations were monitored continuously. The  ?  Colonoscope was introduced through the anus and  ?                       advanced to the the cecum, identified by appendiceal  ?                       orifice and ileocecal valve. The colonoscopy was  ?                       performed without difficulty. The patient tolerated  ?                       the procedure well. The quality of the bowel  ?                       preparation was adequate to identify polyps 6 mm and  ?                       larger in size. ?Findings: ?     The perianal and digital rectal examinations were normal. Pertinent  ?     negatives include normal sphincter  tone and no palpable rectal lesions. ?     The entire examined colon appeared normal. ?     The retroflexed view of the distal rectum and anal verge was normal and  ?     showed no anal or rectal abnormalities. ?Impression:            - The entire examined colon is normal. ?                       - The distal rectum and anal verge are normal on  ?                       retroflexion view. ?                       - No specimens collected. ?Recommendation:        - Discharge patient to home (with escort). ?                       - Resume previous diet today. ?                       - Continue present medications. ?                       - Repeat colonoscopy in 10 years for screening  ?                       purposes. ?Procedure Code(s):     --- Professional --- ?                       I7782, Colorectal cancer screening; colonoscopy on  ?                       individual not meeting criteria for high risk ?Diagnosis Code(s):     --- Professional --- ?                       Z12.11, Encounter for  screening for malignant neoplasm  ?                       of colon ?CPT copyright 2019 American Medical Association. All rights reserved. ?The codes documented in this report are preliminary and upon coder review may  ?be revised to meet current compliance requirements. ?Dr. Libby Mawonini Karol Skarzynski ?Jazier Mcglamery Alger Memoseddy Ariane Ditullio MD, MD ?06/03/2021 8:20:52 AM ?This report has been signed electronically. ?Number of Addenda: 0 ?Note Initiated On: 06/03/2021 8:00 AM ?Scope Withdrawal Time: 0 hours 10 minutes 26 seconds  ?Total Procedure Duration: 0 hours 13 minutes 32 seconds  ?Estimated Blood Loss:  Estimated blood loss: none. ?     [facility] ?

## 2021-06-03 NOTE — Anesthesia Preprocedure Evaluation (Signed)
Anesthesia Evaluation  ?Patient identified by MRN, date of birth, ID band ?Patient awake ? ? ? ?Reviewed: ?Allergy & Precautions, NPO status , Patient's Chart, lab work & pertinent test results ? ?Airway ?Mallampati: II ? ?TM Distance: >3 FB ?Neck ROM: full ? ? ? Dental ? ?(+) Teeth Intact ?  ?Pulmonary ?neg pulmonary ROS, former smoker,  ?  ?Pulmonary exam normal ?breath sounds clear to auscultation ? ? ? ? ? ? Cardiovascular ?Exercise Tolerance: Good ?negative cardio ROS ?Normal cardiovascular exam+ dysrhythmias  ?Rhythm:Regular Rate:Normal ? ?Occasional PVC's ?  ?Neuro/Psych ?negative neurological ROS ? negative psych ROS  ? GI/Hepatic ?negative GI ROS, Neg liver ROS, GERD  ,  ?Endo/Other  ?negative endocrine ROSdiabetes, Well Controlled, Type 2 ? Renal/GU ?negative Renal ROS  ?negative genitourinary ?  ?Musculoskeletal ?negative musculoskeletal ROS ?(+)  ? Abdominal ?Normal abdominal exam  (+)   ?Peds ?negative pediatric ROS ?(+)  Hematology ?negative hematology ROS ?(+)   ?Anesthesia Other Findings ?Past Medical History: ?No date: Chicken pox ?No date: Diabetes mellitus without complication (HCC) ? ?Past Surgical History: ?No date: GALLBLADDER SURGERY ?2005: KNEE ARTHROSCOPY; Right ? ?BMI   ? Body Mass Index: 36.11 kg/m?  ?  ? ? Reproductive/Obstetrics ?negative OB ROS ? ?  ? ? ? ? ? ? ? ? ? ? ? ? ? ?  ?  ? ? ? ? ? ? ? ? ?Anesthesia Physical ?Anesthesia Plan ? ?ASA: 2 ? ?Anesthesia Plan: General  ? ?Post-op Pain Management:   ? ?Induction: Intravenous ? ?PONV Risk Score and Plan: Propofol infusion and TIVA ? ?Airway Management Planned: Natural Airway and Nasal Cannula ? ?Additional Equipment:  ? ?Intra-op Plan:  ? ?Post-operative Plan:  ? ?Informed Consent: I have reviewed the patients History and Physical, chart, labs and discussed the procedure including the risks, benefits and alternatives for the proposed anesthesia with the patient or authorized representative who has  indicated his/her understanding and acceptance.  ? ? ? ?Dental Advisory Given ? ?Plan Discussed with: CRNA and Surgeon ? ?Anesthesia Plan Comments:   ? ? ? ? ? ? ?Anesthesia Quick Evaluation ? ?

## 2021-06-03 NOTE — H&P (Signed)
?Colleen Darby, MD ?[address]  ?[address]  ?[place], [place] [address]  ?Main: [phone]  ?Fax: [phone] ?Pager: [phone] ? ?Primary Care Physician:  Colleen Pheasant, MD ?Primary Gastroenterologist:  Dr. Cephas Wagner ? ?Pre-Procedure History & Physical: ?HPI:  Colleen Wagner is a 46 y.o. female is here for an colonoscopy. ?  ?Past Medical History:  ?Diagnosis Date  ? Chicken pox   ? Diabetes mellitus without complication ([place])   ? ? ?Past Surgical History:  ?Procedure Laterality Date  ? GALLBLADDER SURGERY    ? KNEE ARTHROSCOPY Right 2005  ? ? ?Prior to Admission medications   ?Medication Sig Start Date End Date Taking? Authorizing Provider  ?famotidine (PEPCID) 20 MG tablet Take 20 mg by mouth daily.   Yes [provider]  ?metFORMIN (GLUCOPHAGE-XR) 500 MG 24 hr tablet TAKE 1 TABLET TWICE A DAY 03/23/21  Yes Colleen Pheasant, MD  ?rosuvastatin (CRESTOR) 5 MG tablet Take 1 tablet (5 mg total) by mouth daily. 04/05/21  Yes Colleen Pheasant, MD  ?blood glucose meter kit and supplies Dispense based on patient and insurance preference. Use up to four times daily as directed. (FOR ICD-10 E10.9, E11.9). 11/10/19   Colleen Regal, NP  ?levonorgestrel (MIRENA) 20 MCG/24HR IUD by Intrauterine route.    [provider]  ?tirzepatide Darcel Bayley) 7.5 MG/0.5ML Pen Inject 7.5 mg into the skin once a week. 04/27/21   Colleen Pheasant, MD  ? ? ?Allergies as of 05/10/2021  ? (No Known Allergies)  ? ? ?Family History  ?Problem Relation Age of Onset  ? Hyperlipidemia Mother   ? Hypertension Father   ? Diabetes Father   ? Stroke Father   ? Miscarriages / Stillbirths Sister   ? Arthritis Paternal Grandmother   ? Diabetes Paternal Grandmother   ? Breast cancer Neg Hx   ? ? ?Social History  ? ?Socioeconomic History  ? Marital status: Single  ?  Spouse name: Not on file  ? Number of children: Not on file  ? Years of education: Not on file  ? Highest education level: Bachelor's degree (e.g., BA, AB,  BS)  ?Occupational History  ? Not on file  ?Tobacco Use  ? Smoking status: Former  ?  Packs/day: 0.50  ?  Years: 25.00  ?  Pack years: 12.50  ?  Types: Cigarettes  ?  Quit date: 09/2020  ?  Years since quitting: 0.7  ? Smokeless tobacco: Never  ? Tobacco comments:  ?  Enjoys smoking  ?Vaping Use  ? Vaping Use: Former  ?Substance and Sexual Activity  ? Alcohol use: Yes  ?  Comment: rarely  ? Drug use: Never  ? Sexual activity: Yes  ?  Birth control/protection: I.U.D.  ?Other Topics Concern  ? Not on file  ?Social History Narrative  ? No married with 2 sons and works [employer]  ? ?Social Determinants of Health  ? ?Financial Resource Strain: Low Risk   ? Difficulty of Paying Living Expenses: Not hard at all  ?Food Insecurity: Not on file  ?Transportation Needs: Not on file  ?Physical Activity: Not on file  ?Stress: Not on file  ?Social Connections: Not on file  ?Intimate Partner Violence: Not on file  ? ? ?Review of Systems: ?See HPI, otherwise negative ROS ? ?Physical Exam: ?BP 130/90   Pulse 67   Temp (!) 97 ?F (36.1 ?C) (Temporal)   Resp 16   Ht '5\' 5"'  (1.651 m)   Wt 98.4  kg   SpO2 100%   BMI 36.11 kg/m?  ?General:   Alert,  pleasant and cooperative in NAD ?Head:  Normocephalic and atraumatic. ?Neck:  Supple; no masses or thyromegaly. ?Lungs:  Clear throughout to auscultation.    ?Heart:  Regular rate and rhythm. ?Abdomen:  Soft, nontender and nondistended. Normal bowel sounds, without guarding, and without rebound.   ?Neurologic:  Alert and  oriented x4;  grossly normal neurologically. ? ?Impression/Plan: ?Colleen Wagner is here for an colonoscopy to be performed for colon cancer screening ? ?Risks, benefits, limitations, and alternatives regarding  colonoscopy have been reviewed with the patient.  Questions have been answered.  All parties agreeable. ? ? ?Sherri Sear, MD  06/03/2021, 7:51 AM ?

## 2021-06-03 NOTE — Transfer of Care (Signed)
Immediate Anesthesia Transfer of Care Note ? ?Patient: Colleen Wagner ? ?Procedure(s) Performed: COLONOSCOPY WITH PROPOFOL ? ?Patient Location: Endoscopy Unit ? ?Anesthesia Type:General ? ?Level of Consciousness: drowsy ? ?Airway & Oxygen Therapy: Patient Spontanous Breathing ? ?Post-op Assessment: Report given to RN and Post -op Vital signs reviewed and stable ? ?Post vital signs: Reviewed and stable ? ?Last Vitals:  ?Vitals Value Taken Time  ?BP 129/76 06/03/21 0822  ?Temp    ?Pulse 90 06/03/21 0822  ?Resp 14 06/03/21 0822  ?SpO2 98 % 06/03/21 0822  ?Vitals shown include unvalidated device data. ? ?Last Pain:  ?Vitals:  ? 06/03/21 0734  ?TempSrc: Temporal  ?PainSc: 0-No pain  ?   ? ?  ? ?Complications: No notable events documented. ?

## 2021-06-03 NOTE — Anesthesia Postprocedure Evaluation (Signed)
Anesthesia Post Note ? ?Patient: Colleen Wagner ? ?Procedure(s) Performed: COLONOSCOPY WITH PROPOFOL ? ?Patient location during evaluation: PACU ?Anesthesia Type: General ?Level of consciousness: awake and oriented ?Pain management: satisfactory to patient ?Vital Signs Assessment: post-procedure vital signs reviewed and stable ?Respiratory status: spontaneous breathing and respiratory function stable ?Cardiovascular status: stable ?Anesthetic complications: no ? ? ?No notable events documented. ? ? ?Last Vitals:  ?Vitals:  ? 06/03/21 0842 06/03/21 0852  ?BP: 133/87 123/84  ?Pulse: 71 65  ?Resp: 19 11  ?Temp:    ?SpO2: 100% 100%  ?  ?Last Pain:  ?Vitals:  ? 06/03/21 0852  ?TempSrc:   ?PainSc: 0-No pain  ? ? ?  ?  ?  ?  ?  ?  ? ?VAN STAVEREN,Gresham Caetano ? ? ? ? ?

## 2021-06-04 ENCOUNTER — Encounter: Payer: Self-pay | Admitting: Gastroenterology

## 2021-06-11 ENCOUNTER — Ambulatory Visit: Payer: BC Managed Care – PPO | Admitting: Cardiology

## 2021-06-11 ENCOUNTER — Ambulatory Visit (INDEPENDENT_AMBULATORY_CARE_PROVIDER_SITE_OTHER): Payer: BC Managed Care – PPO

## 2021-06-11 ENCOUNTER — Encounter: Payer: Self-pay | Admitting: Cardiology

## 2021-06-11 VITALS — BP 120/70 | HR 77 | Ht 65.0 in | Wt 214.0 lb

## 2021-06-11 DIAGNOSIS — E7800 Pure hypercholesterolemia, unspecified: Secondary | ICD-10-CM

## 2021-06-11 DIAGNOSIS — I493 Ventricular premature depolarization: Secondary | ICD-10-CM | POA: Diagnosis not present

## 2021-06-11 NOTE — Patient Instructions (Addendum)
Medication Instructions:  ?Your physician recommends that you continue on your current medications as directed. Please refer to the Current Medication list given to you today. ? ?*If you need a refill on your cardiac medications before your next appointment, please call your pharmacy* ? ? ?Lab Work: ?None ordered ?If you have labs (blood work) drawn today and your tests are completely normal, you will receive your results only by: ?MyChart Message (if you have MyChart) OR ?A paper copy in the mail ?If you have any lab test that is abnormal or we need to change your treatment, we will call you to review the results. ? ? ?Testing/Procedures: ?Your physician has requested that you have an echocardiogram. Echocardiography is a painless test that uses sound waves to create images of your heart. It provides your doctor with information about the size and shape of your heart and how well your heart?s chambers and valves are working. This procedure takes approximately one hour. There are no restrictions for this procedure. ? ?Your provider has ordered a heart monitor to wear for 14 days. This will be mailed to your home with instructions on placement. Once you have finished the time frame requested, you will return monitor in box provided. ? ?  ? ? ?Follow-Up: ?At [facility], you and your health needs are our priority.  As part of our continuing mission to provide you with exceptional heart care, we have created designated Provider Care Teams.  These Care Teams include your primary Cardiologist (physician) and Advanced Practice Providers (APPs -  Physician Assistants and Nurse Practitioners) who all work together to provide you with the care you need, when you need it. ? ?We recommend signing up for the patient portal called "MyChart".  Sign up information is provided on this After Visit Summary.  MyChart is used to connect with patients for Virtual Visits (Telemedicine).  Patients are able to view lab/test results,  encounter notes, upcoming appointments, etc.  Non-urgent messages can be sent to your provider as well.   ?To learn more about what you can do with MyChart, go to [email or web address].   ? ?Your next appointment:   ?5 week(s) ? ?The format for your next appointment:   ?In Person ? ?Provider:   ?You may see Debbe Odea, MD or one of the following Advanced Practice Providers on your designated Care Team:   ?Nicolasa Ducking, NP ?Eula Listen, PA-C ?Cadence Fransico Michael, PA-C  ? ? ?Other Instructions ?N/A ?

## 2021-06-11 NOTE — Progress Notes (Signed)
?Cardiology Office Note:   ? ?Date:  06/11/2021  ? ?ID:  Colleen Wagner, DOB 05/07/1975, MRN [id number] ? ?PCP:  Colleen Pheasant, MD ?  ?[facility] Providers ?Cardiologist:  Colleen Sable, MD    ? ?Referring MD: Colleen Pheasant, MD  ? ?Chief Complaint  ?Patient presents with  ? New Patient (Initial Visit)  ?  Referred by PCP for Trigeminy. Meds reviewed verbally with patient.   ? ?Colleen Wagner is a 46 y.o. female who is being seen today for the evaluation of abnormal EKG at the request of Colleen Pheasant, MD. ? ? ?History of Present Illness:   ? ?Colleen Wagner is a 46 y.o. female with a hx of hyperlipidemia, diabetes, former smoker x30 years who presents due to frequent PVCs.  Patient saw primary care provider about a month ago for scheduled visit, irregular heart rhythm noted on exam.  EKG obtained 05/10/2021 showed frequent PVCs in a trigeminal pattern. ? ?She denies palpitations, chest pain, shortness of breath.  She feels well otherwise.  Denies dizziness or presyncope.  Denies any history of heart disease.  Takes medications as prescribed. ? ?Past Medical History:  ?Diagnosis Date  ? Chicken pox   ? Diabetes mellitus without complication ([place])   ? ? ?Past Surgical History:  ?Procedure Laterality Date  ? COLONOSCOPY WITH PROPOFOL N/A 06/03/2021  ? Procedure: COLONOSCOPY WITH PROPOFOL;  Surgeon: Lin Landsman, MD;  Location: [facility] ENDOSCOPY;  Service: Gastroenterology;  Laterality: N/A;  ? GALLBLADDER SURGERY    ? KNEE ARTHROSCOPY Right 2005  ? ? ?Current Medications: ?Current Meds  ?Medication Sig  ? blood glucose meter kit and supplies Dispense based on patient and insurance preference. Use up to four times daily as directed. (FOR ICD-10 E10.9, E11.9).  ? famotidine (PEPCID) 20 MG tablet Take 20 mg by mouth daily.  ? levonorgestrel (MIRENA) 20 MCG/24HR IUD by Intrauterine route.  ? metFORMIN (GLUCOPHAGE-XR) 500 MG 24 hr tablet TAKE 1 TABLET TWICE A DAY  ? rosuvastatin (CRESTOR) 5 MG  tablet Take 1 tablet (5 mg total) by mouth daily.  ? tirzepatide (MOUNJARO) 7.5 MG/0.5ML Pen Inject 7.5 mg into the skin once a week.  ?  ? ?Allergies:   Patient has no known allergies.  ? ?Social History  ? ?Socioeconomic History  ? Marital status: Single  ?  Spouse name: Not on file  ? Number of children: Not on file  ? Years of education: Not on file  ? Highest education level: Bachelor's degree (e.g., BA, AB, BS)  ?Occupational History  ? Not on file  ?Tobacco Use  ? Smoking status: Former  ?  Packs/day: 0.50  ?  Years: 25.00  ?  Pack years: 12.50  ?  Types: Cigarettes  ?  Quit date: 09/2020  ?  Years since quitting: 0.7  ? Smokeless tobacco: Never  ? Tobacco comments:  ?  Enjoys smoking  ?Vaping Use  ? Vaping Use: Former  ?Substance and Sexual Activity  ? Alcohol use: Yes  ?  Comment: rarely  ? Drug use: Never  ? Sexual activity: Yes  ?  Birth control/protection: I.U.D.  ?Other Topics Concern  ? Not on file  ?Social History Narrative  ? No married with 2 sons and works [employer]  ? ?Social Determinants of Health  ? ?Financial Resource Strain: Low Risk   ? Difficulty of Paying Living Expenses: Not hard at all  ?Food Insecurity: Not on file  ?Transportation Needs: Not  on file  ?Physical Activity: Not on file  ?Stress: Not on file  ?Social Connections: Not on file  ?  ? ?Family History: ?The patient's family history includes Arthritis in her paternal grandmother; Diabetes in her father and paternal grandmother; Hyperlipidemia in her mother; Hypertension in her father; Miscarriages / [place] in her sister; Stroke in her father. There is no history of Breast cancer. ? ?ROS:   ?Please see the history of present illness.    ? All other systems reviewed and are negative. ? ?EKGs/Labs/Other Studies Reviewed:   ? ?The following studies were reviewed today: ? ? ?EKG:  EKG is  ordered today.  The ekg ordered today demonstrates normal sinus rhythm, normal ECG. ? ?Recent Labs: ?01/08/2021: Hemoglobin 13.8;  Platelets 221.0; TSH 2.07 ?05/07/2021: ALT 10; BUN 9; Creatinine, Ser 0.69; Potassium 4.1; Sodium 140  ?Recent Lipid Panel ?   ?Component Value Date/Time  ? CHOL 123 05/07/2021 0855  ? TRIG 187.0 (H) 05/07/2021 0855  ? HDL 47.40 05/07/2021 0855  ? CHOLHDL 3 05/07/2021 0855  ? VLDL 37.4 05/07/2021 0855  ? [place] 38 05/07/2021 0855  ? ? ? ?Risk Assessment/Calculations:   ? ? ?    ? ?Physical Exam:   ? ?VS:  BP 120/70 (BP Location: Left Arm, Patient Position: Sitting, Cuff Size: Normal)   Pulse 77   Ht '5\' 5"'  (1.651 m)   Wt 214 lb (97.1 kg)   BMI 35.61 kg/m?    ? ?Wt Readings from Last 3 Encounters:  ?06/11/21 214 lb (97.1 kg)  ?06/03/21 217 lb (98.4 kg)  ?05/10/21 222 lb (100.7 kg)  ?  ? ?GEN:  Well nourished, well developed in no acute distress ?HEENT: Normal ?NECK: No JVD; No carotid bruits ?LYMPHATICS: No lymphadenopathy ?CARDIAC: RRR, no murmurs, rubs, gallops ?RESPIRATORY:  Clear to auscultation without rales, wheezing or rhonchi  ?ABDOMEN: Soft, non-tender, non-distended ?MUSCULOSKELETAL:  No edema; No deformity  ?SKIN: Warm and dry ?NEUROLOGIC:  Alert and oriented x 3 ?PSYCHIATRIC:  Normal affect  ? ?ASSESSMENT:   ? ?1. Frequent PVCs   ?2. Pure hypercholesterolemia   ? ?PLAN:   ? ?In order of problems listed above: ? ?Frequent PVCs on EKG 05/10/2021, ventricular trigeminy.  EKG today normal with no PVCs.  Place cardiac monitor x2 week to evaluate PVC burden.  Obtain echocardiogram.  Patient otherwise asymptomatic with no symptoms of chest pain or shortness of breath. ?Hyperlipidemia, continue Crestor, continue low-cholesterol diet. ? ?Follow-up after echo and cardiac monitor. ? ?   ? ? ?Medication Adjustments/Labs and Tests Ordered: ?Current medicines are reviewed at length with the patient today.  Concerns regarding medicines are outlined above.  ?Orders Placed This Encounter  ?Procedures  ? LONG TERM MONITOR (3-14 DAYS)  ? EKG 12-Lead  ? ECHOCARDIOGRAM COMPLETE  ? ?No orders of the defined types were placed  in this encounter. ? ? ?Patient Instructions  ?Medication Instructions:  ?Your physician recommends that you continue on your current medications as directed. Please refer to the Current Medication list given to you today. ? ?*If you need a refill on your cardiac medications before your next appointment, please call your pharmacy* ? ? ?Lab Work: ?None ordered ?If you have labs (blood work) drawn today and your tests are completely normal, you will receive your results only by: ?MyChart Message (if you have MyChart) OR ?A paper copy in the mail ?If you have any lab test that is abnormal or we need to change your treatment, we will call you  to review the results. ? ? ?Testing/Procedures: ?Your physician has requested that you have an echocardiogram. Echocardiography is a painless test that uses sound waves to create images of your heart. It provides your doctor with information about the size and shape of your heart and how well your heart?s chambers and valves are working. This procedure takes approximately one hour. There are no restrictions for this procedure. ? ?Your provider has ordered a heart monitor to wear for 14 days. This will be mailed to your home with instructions on placement. Once you have finished the time frame requested, you will return monitor in box provided. ? ?  ? ? ?Follow-Up: ?At [facility], you and your health needs are our priority.  As part of our continuing mission to provide you with exceptional heart care, we have created designated Provider Care Teams.  These Care Teams include your primary Cardiologist (physician) and Advanced Practice Providers (APPs -  Physician Assistants and Nurse Practitioners) who all work together to provide you with the care you need, when you need it. ? ?We recommend signing up for the patient portal called "MyChart".  Sign up information is provided on this After Visit Summary.  MyChart is used to connect with patients for Virtual Visits (Telemedicine).   Patients are able to view lab/test results, encounter notes, upcoming appointments, etc.  Non-urgent messages can be sent to your provider as well.   ?To learn more about what you can do with MyChart, go to https://

## 2021-06-15 DIAGNOSIS — I493 Ventricular premature depolarization: Secondary | ICD-10-CM | POA: Diagnosis not present

## 2021-06-18 ENCOUNTER — Other Ambulatory Visit: Payer: Self-pay | Admitting: Internal Medicine

## 2021-06-18 DIAGNOSIS — E119 Type 2 diabetes mellitus without complications: Secondary | ICD-10-CM

## 2021-06-24 NOTE — Telephone Encounter (Signed)
Called and advised patient to go to web site and apply for the [employer] card 25 for 3 month supply she will try this first. ?

## 2021-06-24 NOTE — Telephone Encounter (Signed)
She is diabetic.  I do not mind changing her medication, but I am not sure which will be cheaper - with her insurance.  ?

## 2021-07-02 ENCOUNTER — Ambulatory Visit (INDEPENDENT_AMBULATORY_CARE_PROVIDER_SITE_OTHER): Payer: BC Managed Care – PPO

## 2021-07-02 DIAGNOSIS — I493 Ventricular premature depolarization: Secondary | ICD-10-CM | POA: Diagnosis not present

## 2021-07-02 DIAGNOSIS — I340 Nonrheumatic mitral (valve) insufficiency: Secondary | ICD-10-CM | POA: Diagnosis not present

## 2021-07-02 DIAGNOSIS — I5030 Unspecified diastolic (congestive) heart failure: Secondary | ICD-10-CM

## 2021-07-02 LAB — ECHOCARDIOGRAM COMPLETE
AR max vel: 3.12 cm2
AV Area VTI: 2.79 cm2
AV Area mean vel: 2.32 cm2
AV Mean grad: 2 mmHg
AV Peak grad: 4 mmHg
Ao pk vel: 1 m/s
Area-P 1/2: 4.39 cm2
Calc EF: 62.4 %
S' Lateral: 2.7 cm
Single Plane A2C EF: 64 %
Single Plane A4C EF: 59.2 %

## 2021-07-12 ENCOUNTER — Ambulatory Visit
Admission: EM | Admit: 2021-07-12 | Discharge: 2021-07-12 | Disposition: A | Discharge status: Home / Self Care | Payer: BC Managed Care – PPO | Attending: Emergency Medicine | Admitting: Emergency Medicine

## 2021-07-12 DIAGNOSIS — M545 Low back pain, unspecified: Secondary | ICD-10-CM

## 2021-07-12 MED ORDER — NAPROXEN SODIUM 550 MG PO TABS: 550.0000 mg | ORAL_TABLET | Freq: Two times a day (BID) | ORAL | 0 refills | Status: DC

## 2021-07-12 MED ORDER — KETOROLAC TROMETHAMINE 60 MG/2ML IM SOLN
30.0000 mg | Freq: Once | INTRAMUSCULAR | Status: AC
  Administered 2021-07-12: 30 mg via INTRAMUSCULAR

## 2021-07-12 NOTE — Discharge Instructions (Addendum)
Your pain is most likely caused by irritation to the muscles or ligaments.  ? ?Starting tomorrow begin use of naproxen twice daily for 5 days then as needed ? ?May continue use of muscle relaxer as needed.  ? ?You may use heating pad in 15 minute intervals as needed for additional comfort, or you may find comfort in using ice in 10-15 minutes over affected area ? ?Begin stretching affected area daily for 10 minutes as tolerated to further loosen muscles  ? ?When lying down place pillow underneath and between knees for support ? ?Can try sleeping without pillow on firm mattress  ? ?Practice good posture: head back, shoulders back, chest forward, pelvis back and weight distributed evenly on both legs ? ?If pain persist after recommended treatment or reoccurs if may be beneficial to follow up with orthopedic specialist for evaluation, this doctor specializes in the bones and can manage your symptoms long-term with options such as but not limited to imaging, medications or physical therapy  ?  ?

## 2021-07-12 NOTE — ED Triage Notes (Signed)
Pt c/o back pain x3day. ? ?Pt went to pick up a wheelchair and pulled her back. Pt states that the pain has gotten worse.  ?

## 2021-07-12 NOTE — ED Provider Notes (Signed)
?[name] ? ? ? ?CSN: [id number] ?Arrival date & time: 07/12/21  1325 ? ? ?  ? ?History   ?Chief Complaint ?Chief Complaint  ?Patient presents with  ? Back Pain  ? ? ?HPI ?Colleen Wagner is a 46 y.o. female.  ? ?Patient presents with right-sided lower back pain radiating into the mid lower back for 3 days beginning after picking up a wheelchair.  Symptoms are worsened by long periods of sitting and lying, improved by standing.  Pain can be felt with range of motion.  Has attempted use of Tylenol, baclofen, lidocaine patches, ice, heat with no improvement.  Denies numbness, tingling, urinary or bowel incontinence.  History of type 2 diabetes ? ? ? ?Past Medical History:  ?Diagnosis Date  ? Chicken pox   ? Diabetes mellitus without complication ([place])   ? ? ?Patient Active Problem List  ? Diagnosis Date Noted  ? Irregular heart rhythm 05/10/2021  ? GERD (gastroesophageal reflux disease) 01/16/2021  ? Healthcare maintenance 09/23/2020  ? Stress 09/08/2020  ? Breast cancer screening 05/30/2020  ? Colon cancer screening 05/30/2020  ? Nicotine use disorder 11/29/2019  ? Menorrhagia with irregular cycle 11/08/2019  ? Hyperlipidemia 11/08/2019  ? Tobacco use 11/08/2019  ? Chronic right shoulder pain 11/08/2019  ? Acquired deformity of left foot 11/08/2019  ? Type 2 diabetes mellitus with hyperglycemia ([place]) 08/14/2019  ? 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 ? ?Past Surgical History:  ?Procedure Laterality Date  ? COLONOSCOPY WITH PROPOFOL N/A 06/03/2021  ? Procedure: COLONOSCOPY WITH PROPOFOL;  Surgeon: Lin Landsman, MD;  Location: [facility] ENDOSCOPY;  Service: Gastroenterology;  Laterality: N/A;  ? GALLBLADDER SURGERY    ? KNEE ARTHROSCOPY Right 2005  ? ? ?OB History   ?No obstetric history on file. ?  ? ? ? ?Home Medications   ? ?Prior to Admission medications   ?Medication Sig Start Date End Date Taking? Authorizing Provider  ?blood glucose meter kit and supplies Dispense based on patient and insurance  preference. Use up to four times daily as directed. (FOR ICD-10 E10.9, E11.9). 11/10/19  Yes Marval Regal, NP  ?famotidine (PEPCID) 20 MG tablet Take 20 mg by mouth daily.   Yes [provider]  ?levonorgestrel (MIRENA) 20 MCG/24HR IUD by Intrauterine route.   Yes [provider]  ?metFORMIN (GLUCOPHAGE-XR) 500 MG 24 hr tablet TAKE 1 TABLET TWICE A DAY 03/23/21  Yes Einar Pheasant, MD  ?MOUNJARO 7.5 MG/0.5ML Pen INJECT 1 DOSE SUBCUTANEOUSLY ONCE A WEEK 06/18/21  Yes Einar Pheasant, MD  ?rosuvastatin (CRESTOR) 5 MG tablet Take 1 tablet (5 mg total) by mouth daily. 04/05/21  Yes Einar Pheasant, MD  ? ? ?Family History ?Family History  ?Problem Relation Age of Onset  ? Hyperlipidemia Mother   ? Hypertension Father   ? Diabetes Father   ? Stroke Father   ? Miscarriages / Stillbirths Sister   ? Arthritis Paternal Grandmother   ? Diabetes Paternal Grandmother   ? Breast cancer Neg Hx   ? ? ?Social History ?Social History  ? ?Tobacco Use  ? Smoking status: Former  ?  Packs/day: 0.50  ?  Years: 25.00  ?  Pack years: 12.50  ?  Types: Cigarettes  ?  Quit date: 09/2020  ?  Years since quitting: 0.8  ? Smokeless tobacco: Never  ? Tobacco comments:  ?  Enjoys smoking  ?Vaping Use  ? Vaping Use: Every day  ?Substance Use Topics  ? Alcohol use: Not Currently  ?  Comment: rarely  ? Drug use: Never  ? ? ? ?Allergies   ?Patient has no known allergies. ? ? ?Review of Systems ?Review of Systems  ?Constitutional: Negative.   ?Respiratory: Negative.    ?Musculoskeletal:  Positive for back pain. Negative for arthralgias, gait problem, joint swelling, myalgias, neck pain and neck stiffness.  ?Skin: Negative.   ? ? ?Physical Exam ?Triage Vital Signs ?ED Triage Vitals  ?Enc Vitals Group  ?   BP 07/12/21 1406 (!) 132/92  ?   Pulse Rate 07/12/21 1406 62  ?   Resp 07/12/21 1406 18  ?   Temp 07/12/21 1406 97.8 ?F (36.6 ?C)  ?   Temp Source 07/12/21 1406 Oral  ?   SpO2 07/12/21 1406 100 %  ?   Weight 07/12/21 1405 209 lb  (94.8 kg)  ?   Height 07/12/21 1405 '5\' 5"'  (1.651 m)  ?   Head Circumference --   ?   Peak Flow --   ?   Pain Score 07/12/21 1405 9  ?   Pain Loc --   ?   Pain Edu? --   ?   Excl. in [place]? --   ? ?No data found. ? ?Updated Vital Signs ?BP (!) 132/92 (BP Location: Left Arm)   Pulse 62   Temp 97.8 ?F (36.6 ?C) (Oral)   Resp 18   Ht '5\' 5"'  (1.651 m)   Wt 209 lb (94.8 kg)   SpO2 100%   BMI 34.78 kg/m?  ? ?Visual Acuity ?Right Eye Distance:   ?Left Eye Distance:   ?Bilateral Distance:   ? ?Right Eye Near:   ?Left Eye Near:    ?Bilateral Near:    ? ?Physical Exam ?Constitutional:   ?   Appearance: Normal appearance.  ?HENT:  ?   Head: Normocephalic.  ?Eyes:  ?   Extraocular Movements: Extraocular movements intact.  ?Pulmonary:  ?   Effort: Pulmonary effort is normal.  ?Musculoskeletal:  ?   Comments: Tenderness over the right latissimus dorsi extending into the buttocks, range of motion intact, able to stand erect and walk to exam room without assistance  ?Neurological:  ?   Mental Status: She is alert and oriented to person, place, and time. Mental status is at baseline.  ?Psychiatric:     ?   Mood and Affect: Mood normal.     ?   Behavior: Behavior normal.  ? ? ? ?UC Treatments / Results  ?Labs ?(all labs ordered are listed, but only abnormal results are displayed) ?Labs Reviewed - No data to display ? ?EKG ? ? ?Radiology ?No results found. ? ?Procedures ?Procedures (including critical care time) ? ?Medications Ordered in UC ?Medications - No data to display ? ?Initial Impression / Assessment and Plan / UC Course  ?I have reviewed the triage vital signs and the nursing notes. ? ?Pertinent labs & imaging results that were available during my care of the patient were reviewed by me and considered in my medical decision making (see chart for details). ? ?Acute right-sided low back pain without sciatica ? ?Etiology of symptoms is most likely muscular, discussed with patient, Toradol injection given in office, naproxen  twice daily for 5 days then as needed prescribed for outpatient use, may continue muscle relaxer as needed, recommended RICE, heat, pillows for support, daily stretching and activity as tolerated, given walking referral to orthopedics if symptoms continue to persist ?Final Clinical Impressions(s) / UC Diagnoses  ? ?Final diagnoses:  ?None  ? ?  Discharge Instructions   ?None ?  ? ?ED Prescriptions   ?None ?  ? ?PDMP not reviewed this encounter. ?  ?Hans Eden, NP ?07/12/21 1554 ? ?

## 2021-07-16 ENCOUNTER — Other Ambulatory Visit: Payer: Self-pay | Admitting: Internal Medicine

## 2021-07-16 ENCOUNTER — Encounter: Payer: Self-pay | Admitting: Cardiology

## 2021-07-16 ENCOUNTER — Ambulatory Visit: Payer: BC Managed Care – PPO | Admitting: Cardiology

## 2021-07-16 VITALS — BP 120/80 | HR 84 | Ht 65.0 in | Wt 212.0 lb

## 2021-07-16 DIAGNOSIS — I493 Ventricular premature depolarization: Secondary | ICD-10-CM

## 2021-07-16 DIAGNOSIS — Z6835 Body mass index (BMI) 35.0-35.9, adult: Secondary | ICD-10-CM | POA: Diagnosis not present

## 2021-07-16 DIAGNOSIS — E7800 Pure hypercholesterolemia, unspecified: Secondary | ICD-10-CM | POA: Diagnosis not present

## 2021-07-16 NOTE — Progress Notes (Signed)
?Cardiology Office Note:   ? ?Date:  07/16/2021  ? ?ID:  Colleen Wagner, DOB [birth year], MRN [id number] ? ?PCP:  Einar Pheasant, MD ?  ?[facility] Providers ?Cardiologist:  Kate Sable, MD    ? ?Referring MD: Einar Pheasant, MD  ? ?Chief Complaint  ?Patient presents with  ? OTher  ?  Follow up post testing. Meds reviewed verbally with patient.   ? ? ?History of Present Illness:   ? ?Colleen Wagner is a 46 y.o. female with a hx of hyperlipidemia, frequent PVCs, diabetes, former smoker x30 years who presents for follow-up.  Previously seen due to frequent PVCs.   ? ?Cardiac monitor was placed to evaluate PVC burden, echocardiogram obtained to evaluate any cardiac dysfunction.  Patient denies any symptoms of chest pain, shortness of breath, palpitations.  Feels well, has no complaints at this time.  Presents for echo and cardiac monitor testing results. ? ?Past Medical History:  ?Diagnosis Date  ? Chicken pox   ? Diabetes mellitus without complication ([place])   ? ? ?Past Surgical History:  ?Procedure Laterality Date  ? COLONOSCOPY WITH PROPOFOL N/A 06/03/2021  ? Procedure: COLONOSCOPY WITH PROPOFOL;  Surgeon: Lin Landsman, MD;  Location: [facility] ENDOSCOPY;  Service: Gastroenterology;  Laterality: N/A;  ? GALLBLADDER SURGERY    ? KNEE ARTHROSCOPY Right 2005  ? ? ?Current Medications: ?Current Meds  ?Medication Sig  ? blood glucose meter kit and supplies Dispense based on patient and insurance preference. Use up to four times daily as directed. (FOR ICD-10 E10.9, E11.9).  ? famotidine (PEPCID) 20 MG tablet Take 20 mg by mouth daily.  ? levonorgestrel (MIRENA) 20 MCG/24HR IUD by Intrauterine route.  ? metFORMIN (GLUCOPHAGE-XR) 500 MG 24 hr tablet TAKE 1 TABLET TWICE A DAY  ? MOUNJARO 7.5 MG/0.5ML Pen INJECT 1 DOSE SUBCUTANEOUSLY ONCE A WEEK  ? naproxen sodium (ANAPROX DS) 550 MG tablet Take 1 tablet (550 mg total) by mouth 2 (two) times daily with a meal.  ? rosuvastatin (CRESTOR) 5 MG tablet Take 1  tablet (5 mg total) by mouth daily.  ?  ? ?Allergies:   Patient has no known allergies.  ? ?Social History  ? ?Socioeconomic History  ? Marital status: Single  ?  Spouse name: Not on file  ? Number of children: Not on file  ? Years of education: Not on file  ? Highest education level: Bachelor's degree (e.g., BA, AB, BS)  ?Occupational History  ? Not on file  ?Tobacco Use  ? Smoking status: Former  ?  Packs/day: 0.50  ?  Years: 25.00  ?  Pack years: 12.50  ?  Types: Cigarettes  ?  Quit date: 09/2020  ?  Years since quitting: 0.8  ? Smokeless tobacco: Never  ? Tobacco comments:  ?  Enjoys smoking  ?Vaping Use  ? Vaping Use: Every day  ?Substance and Sexual Activity  ? Alcohol use: Not Currently  ?  Comment: rarely  ? Drug use: Never  ? Sexual activity: Yes  ?  Birth control/protection: I.U.D.  ?Other Topics Concern  ? Not on file  ?Social History Narrative  ? No married with 2 sons and works [employer]  ? ?Social Determinants of Health  ? ?Financial Resource Strain: Low Risk   ? Difficulty of Paying Living Expenses: Not hard at all  ?Food Insecurity: Not on file  ?Transportation Needs: Not on file  ?Physical Activity: Not on file  ?Stress: Not on file  ?Social  Connections: Not on file  ?  ? ?Family History: ?The patient's family history includes Arthritis in her paternal grandmother; Diabetes in her father and paternal grandmother; Hyperlipidemia in her mother; Hypertension in her father; Miscarriages / [place] in her sister; Stroke in her father. There is no history of Breast cancer. ? ?ROS:   ?Please see the history of present illness.    ? All other systems reviewed and are negative. ? ?EKGs/Labs/Other Studies Reviewed:   ? ?The following studies were reviewed today: ? ? ?EKG:  EKG not ordered today.   ? ?Recent Labs: ?01/08/2021: Hemoglobin 13.8; Platelets 221.0; TSH 2.07 ?05/07/2021: ALT 10; BUN 9; Creatinine, Ser 0.69; Potassium 4.1; Sodium 140  ?Recent Lipid Panel ?   ?Component Value Date/Time  ? CHOL  123 05/07/2021 0855  ? TRIG 187.0 (H) 05/07/2021 0855  ? HDL 47.40 05/07/2021 0855  ? CHOLHDL 3 05/07/2021 0855  ? VLDL 37.4 05/07/2021 0855  ? [place] 38 05/07/2021 0855  ? ? ? ?Risk Assessment/Calculations:   ? ? ?    ? ?Physical Exam:   ? ?VS:  BP 120/80 (BP Location: Left Arm, Patient Position: Sitting, Cuff Size: Normal)   Pulse 84   Ht '5\' 5"'  (1.651 m)   Wt 212 lb (96.2 kg)   SpO2 98%   BMI 35.28 kg/m?    ? ?Wt Readings from Last 3 Encounters:  ?07/16/21 212 lb (96.2 kg)  ?07/12/21 209 lb (94.8 kg)  ?06/11/21 214 lb (97.1 kg)  ?  ? ?GEN:  Well nourished, well developed in no acute distress ?HEENT: Normal ?NECK: No JVD; No carotid bruits ?CARDIAC: RRR, no murmurs, rubs, gallops ?RESPIRATORY:  Clear to auscultation without rales, wheezing or rhonchi  ?ABDOMEN: Soft, non-tender, non-distended ?MUSCULOSKELETAL:  No edema; No deformity  ?SKIN: Warm and dry ?NEUROLOGIC:  Alert and oriented x 3 ?PSYCHIATRIC:  Normal affect  ? ?ASSESSMENT:   ? ?1. Frequent PVCs   ?2. Pure hypercholesterolemia   ?3. BMI 35.0-35.9,adult   ? ? ?PLAN:   ? ?In order of problems listed above: ? ?Frequent PVCs 6% burden on monitor.  Echo EF 60 to 65%.  Patient is asymptomatic with no symptoms of chest pain or shortness of breath.  Continue to monitor off beta-blockers. ?Hyperlipidemia, continue low-cholesterol diet, on low-dose Crestor. ?Obesity, has lost 50 pounds over the past year, continue low calorie diet, weight loss.  On Mounjaro for diabetes but this has helped with weight loss, . ? ?Follow-up yearly. ? ?   ? ? ?Medication Adjustments/Labs and Tests Ordered: ?Current medicines are reviewed at length with the patient today.  Concerns regarding medicines are outlined above.  ?No orders of the defined types were placed in this encounter. ? ?No orders of the defined types were placed in this encounter. ? ? ?Patient Instructions  ?Medication Instructions:  ?Your physician recommends that you continue on your current medications as  directed. Please refer to the Current Medication list given to you today. ? ?*If you need a refill on your cardiac medications before your next appointment, please call your pharmacy* ? ? ?Lab Work: ?None ordered ?If you have labs (blood work) drawn today and your tests are completely normal, you will receive your results only by: ?MyChart Message (if you have MyChart) OR ?A paper copy in the mail ?If you have any lab test that is abnormal or we need to change your treatment, we will call you to review the results. ? ? ?Testing/Procedures: ?None ordered ? ? ?Follow-Up: ?At  [facility], you and your health needs are our priority.  As part of our continuing mission to provide you with exceptional heart care, we have created designated Provider Care Teams.  These Care Teams include your primary Cardiologist (physician) and Advanced Practice Providers (APPs -  Physician Assistants and Nurse Practitioners) who all work together to provide you with the care you need, when you need it. ? ?We recommend signing up for the patient portal called "MyChart".  Sign up information is provided on this After Visit Summary.  MyChart is used to connect with patients for Virtual Visits (Telemedicine).  Patients are able to view lab/test results, encounter notes, upcoming appointments, etc.  Non-urgent messages can be sent to your provider as well.   ?To learn more about what you can do with MyChart, go to [email or web address].   ? ?Your next appointment:   ?1 year(s) ? ?The format for your next appointment:   ?In Person ? ?Provider:   ?You may see Kate Sable, MD or one of the following Advanced Practice Providers on your designated Care Team:   ?Murray Hodgkins, NP ?Christell Faith, PA-C ?Cadence Kathlen Mody, PA-C  ? ? ?Other Instructions ? ? ?Important Information About Sugar ? ? ? ? ? ?  ? ?Signed, ?Kate Sable, MD  ?07/16/2021 10:25 AM    ?[place] ?

## 2021-07-16 NOTE — Patient Instructions (Signed)

## 2021-07-23 ENCOUNTER — Other Ambulatory Visit: Payer: Self-pay | Admitting: Internal Medicine

## 2021-07-23 DIAGNOSIS — E119 Type 2 diabetes mellitus without complications: Secondary | ICD-10-CM

## 2021-09-09 ENCOUNTER — Other Ambulatory Visit (INDEPENDENT_AMBULATORY_CARE_PROVIDER_SITE_OTHER): Payer: BC Managed Care – PPO

## 2021-09-09 DIAGNOSIS — E119 Type 2 diabetes mellitus without complications: Secondary | ICD-10-CM | POA: Diagnosis not present

## 2021-09-09 DIAGNOSIS — E785 Hyperlipidemia, unspecified: Secondary | ICD-10-CM

## 2021-09-09 LAB — LIPID PANEL
Cholesterol: 145 mg/dL (ref 0–200)
HDL: 44.4 mg/dL (ref >39.00)
LDL Cholesterol: 72 mg/dL (ref 0–99)
NonHDL: 100.74
Total CHOL/HDL Ratio: 3
Triglycerides: 142 mg/dL (ref 0.0–149.0)
VLDL: 28.4 mg/dL (ref 0.0–40.0)

## 2021-09-09 LAB — HEPATIC FUNCTION PANEL
ALT: 9 U/L (ref 0–35)
AST: 15 U/L (ref 0–37)
Albumin: 4.1 g/dL (ref 3.5–5.2)
Alkaline Phosphatase: 79 U/L (ref 39–117)
Bilirubin, Direct: 0.1 mg/dL (ref 0.0–0.3)
Total Bilirubin: 0.5 mg/dL (ref 0.2–1.2)
Total Protein: 7.2 g/dL (ref 6.0–8.3)

## 2021-09-09 LAB — BASIC METABOLIC PANEL
BUN: 13 mg/dL (ref 6–23)
CO2: 27 mEq/L (ref 19–32)
Calcium: 9 mg/dL (ref 8.4–10.5)
Chloride: 103 mEq/L (ref 96–112)
Creatinine, Ser: 0.82 mg/dL (ref 0.40–1.20)
GFR: 85.57 mL/min (ref >60.00)
Glucose, Bld: 99 mg/dL (ref 70–99)
Potassium: 4.2 mEq/L (ref 3.5–5.1)
Sodium: 137 mEq/L (ref 135–145)

## 2021-09-09 LAB — HEMOGLOBIN A1C: Hgb A1c MFr Bld: 5.9 % (ref 4.6–6.5)

## 2021-09-14 ENCOUNTER — Encounter: Payer: Self-pay | Admitting: Internal Medicine

## 2021-09-14 ENCOUNTER — Other Ambulatory Visit: Payer: Self-pay | Admitting: Internal Medicine

## 2021-09-14 ENCOUNTER — Ambulatory Visit (INDEPENDENT_AMBULATORY_CARE_PROVIDER_SITE_OTHER): Payer: BC Managed Care – PPO | Admitting: Internal Medicine

## 2021-09-14 VITALS — BP 116/76 | HR 80 | Temp 98.2°F | Resp 17 | Ht 65.0 in | Wt 207.0 lb

## 2021-09-14 DIAGNOSIS — B977 Papillomavirus as the cause of diseases classified elsewhere: Secondary | ICD-10-CM

## 2021-09-14 DIAGNOSIS — E785 Hyperlipidemia, unspecified: Secondary | ICD-10-CM

## 2021-09-14 DIAGNOSIS — Z1211 Encounter for screening for malignant neoplasm of colon: Secondary | ICD-10-CM

## 2021-09-14 DIAGNOSIS — Z0000 Encounter for general adult medical examination without abnormal findings: Secondary | ICD-10-CM | POA: Diagnosis not present

## 2021-09-14 DIAGNOSIS — Z1231 Encounter for screening mammogram for malignant neoplasm of breast: Secondary | ICD-10-CM

## 2021-09-14 DIAGNOSIS — M25552 Pain in left hip: Secondary | ICD-10-CM

## 2021-09-14 DIAGNOSIS — E119 Type 2 diabetes mellitus without complications: Secondary | ICD-10-CM

## 2021-09-14 DIAGNOSIS — E1165 Type 2 diabetes mellitus with hyperglycemia: Secondary | ICD-10-CM | POA: Diagnosis not present

## 2021-09-14 DIAGNOSIS — M25551 Pain in right hip: Secondary | ICD-10-CM

## 2021-09-14 DIAGNOSIS — F439 Reaction to severe stress, unspecified: Secondary | ICD-10-CM

## 2021-09-14 LAB — HM DIABETES FOOT EXAM

## 2021-09-14 NOTE — Progress Notes (Addendum)
Patient ID: Colleen Wagner, female   DOB: [birth year], 46 y.o.   MRN: [id number]   Subjective:    Patient ID: Colleen Wagner, female    DOB: September 20, 1975, 46 y.o.   MRN: [id number]   Patient here for her physical exam.  .   HPI Has been having issues with diarrhea.  On mounjaro and metformin.  Stopped medication.  Off 2-3 weeks - resolved.  Restarted - now with loose stool - 2-3x/day over the past week.  She is watching her diet.  Doing 12-15 hour fast.  Plans to start walking more.  Stays active.  No chest pain or sob reported.  No abdominal pain.  Does report some bilateral hip joint pain.  When has been sitting for a while - worse.  Getting up and moving around - better.  Was questioning if statin medication contributing.  Increased stress.  Discussed.  Overall feels she is handling things relatively well.    Past Medical History:  Diagnosis Date   Chicken pox    Diabetes mellitus without complication ([place])    Past Surgical History:  Procedure Laterality Date   COLONOSCOPY WITH PROPOFOL N/A 06/03/2021   Procedure: COLONOSCOPY WITH PROPOFOL;  Surgeon: Lin Landsman, MD;  Location: [facility] ENDOSCOPY;  Service: Gastroenterology;  Laterality: N/A;   GALLBLADDER SURGERY     KNEE ARTHROSCOPY Right 2005   Family History  Problem Relation Age of Onset   Hyperlipidemia Mother    Hypertension Father    Diabetes Father    Stroke Father    Miscarriages / [place] Sister    Arthritis Paternal Grandmother    Diabetes Paternal Grandmother    Breast cancer Neg Hx    Social History   Socioeconomic History   Marital status: Single    Spouse name: Not on file   Number of children: Not on file   Years of education: Not on file   Highest education level: Bachelor's degree (e.g., BA, AB, BS)  Occupational History   Not on file  Tobacco Use   Smoking status: Former    Packs/day: 0.50    Years: 25.00    Total pack years: 12.50    Types: Cigarettes    Quit date: 09/2020     Years since quitting: 1.0   Smokeless tobacco: Never   Tobacco comments:    Enjoys smoking  Vaping Use   Vaping Use: Every day  Substance and Sexual Activity   Alcohol use: Not Currently    Comment: rarely   Drug use: Never   Sexual activity: Yes    Birth control/protection: I.U.D.  Other Topics Concern   Not on file  Social History Narrative   No married with 2 sons and works [employer]   Social Determinants of Radio broadcast assistant Strain: Low Risk  (02/05/2021)   Overall Financial Resource Strain (CARDIA)    Difficulty of Paying Living Expenses: Not hard at all  Food Insecurity: Not on file  Transportation Needs: Not on file  Physical Activity: Not on file  Stress: Not on file  Social Connections: Not on file     Review of Systems  Constitutional:  Negative for appetite change and unexpected weight change.  HENT:  Negative for congestion, sinus pressure and sore throat.   Eyes:  Negative for pain and visual disturbance.  Respiratory:  Negative for cough, chest tightness and shortness of breath.   Cardiovascular:  Negative for chest pain, palpitations and leg swelling.  Gastrointestinal:  Positive for diarrhea. Negative for abdominal pain, nausea and vomiting.  Genitourinary:  Negative for difficulty urinating and dysuria.  Musculoskeletal:  Negative for joint swelling and myalgias.       Bilateral hip joint pain as outlined.    Skin:  Negative for color change and rash.  Neurological:  Negative for dizziness, light-headedness and headaches.  Hematological:  Negative for adenopathy. Does not bruise/bleed easily.  Psychiatric/Behavioral:  Negative for decreased concentration and dysphoric mood.        Objective:     BP 116/76 (BP Location: Left Arm, Patient Position: Sitting, Cuff Size: Large)   Pulse 80   Temp 98.2 F (36.8 C) (Temporal)   Resp 17   Ht $R'5\' 5"'Bd$  (1.651 m)   Wt 207 lb (93.9 kg)   SpO2 98%   BMI 34.45 kg/m  Wt Readings from Last 3  Encounters:  09/14/21 207 lb (93.9 kg)  07/16/21 212 lb (96.2 kg)  07/12/21 209 lb (94.8 kg)    Physical Exam Vitals reviewed.  Constitutional:      General: She is not in acute distress.    Appearance: Normal appearance. She is well-developed.  HENT:     Head: Normocephalic and atraumatic.     Right Ear: External ear normal.     Left Ear: External ear normal.  Eyes:     General: No scleral icterus.       Right eye: No discharge.        Left eye: No discharge.     Conjunctiva/sclera: Conjunctivae normal.  Neck:     Thyroid: No thyromegaly.  Cardiovascular:     Rate and Rhythm: Normal rate and regular rhythm.  Pulmonary:     Effort: No tachypnea, accessory muscle usage or respiratory distress.     Breath sounds: Normal breath sounds. No decreased breath sounds or wheezing.  Chest:  Breasts:    Right: No inverted nipple, mass, nipple discharge or tenderness (no axillary adenopathy).     Left: No inverted nipple, mass, nipple discharge or tenderness (no axilarry adenopathy).  Abdominal:     General: Bowel sounds are normal.     Palpations: Abdomen is soft.     Tenderness: There is no abdominal tenderness.  Musculoskeletal:        General: No swelling or tenderness.     Cervical back: Neck supple.  Lymphadenopathy:     Cervical: No cervical adenopathy.  Skin:    Findings: No erythema or rash.  Neurological:     Mental Status: She is alert and oriented to person, place, and time.  Psychiatric:        Mood and Affect: Mood normal.        Behavior: Behavior normal.      Outpatient Encounter Medications as of 09/14/2021  Medication Sig   blood glucose meter kit and supplies Dispense based on patient and insurance preference. Use up to four times daily as directed. (FOR ICD-10 E10.9, E11.9).   famotidine (PEPCID) 20 MG tablet Take 20 mg by mouth daily.   levonorgestrel (MIRENA) 20 MCG/24HR IUD by Intrauterine route.   metFORMIN (GLUCOPHAGE-XR) 500 MG 24 hr tablet TAKE 1  TABLET TWICE A DAY   naproxen sodium (ANAPROX DS) 550 MG tablet Take 1 tablet (550 mg total) by mouth 2 (two) times daily with a meal.   rosuvastatin (CRESTOR) 5 MG tablet Take 1 tablet (5 mg total) by mouth daily.   [DISCONTINUED] MOUNJARO 7.5 MG/0.5ML Pen INJECT 7.5 MG UNDER THE  SKIN ONCE WEEKLY   No facility-administered encounter medications on file as of 09/14/2021.     Lab Results  Component Value Date   WBC 10.4 01/08/2021   HGB 13.8 01/08/2021   HCT 41.6 01/08/2021   PLT 221.0 01/08/2021   GLUCOSE 99 09/09/2021   CHOL 145 09/09/2021   TRIG 142.0 09/09/2021   HDL 44.40 09/09/2021   LDLCALC 72 09/09/2021   ALT 9 09/09/2021   AST 15 09/09/2021   NA 137 09/09/2021   K 4.2 09/09/2021   CL 103 09/09/2021   CREATININE 0.82 09/09/2021   BUN 13 09/09/2021   CO2 27 09/09/2021   TSH 2.07 01/08/2021   HGBA1C 5.9 09/09/2021   MICROALBUR 2.9 (H) 01/08/2021    LONG TERM MONITOR (3-14 DAYS)  Result Date: 07/12/2021 Patch Wear Time:  14 days and 0 hours (2023-04-11T09:42:50-0400 to 2023-04-25T09:42:54-0400) Patient had a min HR of 54 bpm, max HR of 193 bpm, and avg HR of 87 bpm. Predominant underlying rhythm was Sinus Rhythm.  Paroxysmal SVTs  runs occurred, the run with the fastest interval lasting 24.2 secs with a max rate of 193 bpm (avg 170 bpm); the run with the fastest interval was also the longest. SVE Couplets were rare (<1.0%), and SVE Triplets were rare (<1.0%). Isolated VEs were frequent (6.8%, [id number]), VE Couplets were rare (<1.0%, 44), and VE Triplets were rare (<1.0%, 5). Ventricular Bigeminy and Trigeminy were present. Paroxysmal SVT noted.  Frequent PVCs 6.8% burden is also noted.  Obtain echocardiogram as discussed during clinic visit.  Plan to start Toprol-XL.  Follow-up appointment.      Assessment & Plan:   Problem List Items Addressed This Visit     Breast cancer screening   Relevant Orders   MM 3D SCREEN BREAST BILATERAL   Colon cancer screening    Colonoscopy  05/2021 - norma.  Recommended f/u in 10 years.       Healthcare maintenance    Physical today 09/14/21.  Schedule mammogram.  Last 09/23/20 - Birads I.  Wants in [place].  Colonoscopy 06/03/21 - normal.  Recommended f/u colonoscopy in 10 years.  Sees gyn for pelvic and pap smears.       Hip pain    Bilateral hip pain as outlined.  Appears to be c/w arthritis.  She was questioning if statin could be contributing.  Will hold statin.  Call with update over the next two - three weeks.  Follow.        HPV in female    Has been followed by gyn. 09/2020 - PAP ok.       Hyperlipidemia    Low cholesterol diet and exercise.  On crestor.  Follow lipid panel and liver function tests.       Relevant Orders   CBC with Differential/Platelet   Hepatic function panel   TSH   Lipid panel   Stress    Increased stress with her son's health issues.  Discussed.  Overall feels handling things relatively well.  Follow.        Type 2 diabetes mellitus with hyperglycemia ([facility])    On mounjaro - 7.82m and metformin.  Diarrhea as outlined.  Discussed stopping metformin.  Recent a1c 5.9.  Follow sugars.  Has done well with diet and exercise.  Has lost weight.  Follow met b and a1c.        Relevant Orders   Basic metabolic panel   Hemoglobin A1c   Microalbumin / creatinine urine ratio  Other Visit Diagnoses     Routine general medical examination at a health care facility    -  Primary        Einar Pheasant, MD

## 2021-09-14 NOTE — Patient Instructions (Signed)
Hold cholesterol medication.  Hold metformin.  Call with update over the next 2-3 weeks.

## 2021-09-20 ENCOUNTER — Encounter: Payer: Self-pay | Admitting: Internal Medicine

## 2021-09-20 ENCOUNTER — Telehealth: Payer: Self-pay | Admitting: Internal Medicine

## 2021-09-20 DIAGNOSIS — M25559 Pain in unspecified hip: Secondary | ICD-10-CM | POA: Insufficient documentation

## 2021-09-20 NOTE — Assessment & Plan Note (Addendum)
Has been followed by gyn. 09/2020 - PAP ok.

## 2021-09-20 NOTE — Addendum Note (Signed)
Addended by: Charm Barges on: 09/20/2021 08:05 AM   Modules accepted: Orders

## 2021-09-20 NOTE — Telephone Encounter (Signed)
Pt scheduled 8/7 at 3pm at [place] location Pt advised

## 2021-09-20 NOTE — Assessment & Plan Note (Signed)
Colonoscopy 05/2021 - norma.  Recommended f/u in 10 years.  

## 2021-09-20 NOTE — Telephone Encounter (Signed)
Needs mammogram scheduled - in [place].  Order is in system.

## 2021-09-20 NOTE — Assessment & Plan Note (Signed)
Bilateral hip pain as outlined.  Appears to be c/w arthritis.  She was questioning if statin could be contributing.  Will hold statin.  Call with update over the next two - three weeks.  Follow.

## 2021-09-20 NOTE — Assessment & Plan Note (Signed)
Increased stress with her son's health issues.  Discussed.  Overall feels handling things relatively well.  Follow.

## 2021-09-20 NOTE — Assessment & Plan Note (Signed)
On mounjaro - 7.61m and metformin.  Diarrhea as outlined.  Discussed stopping metformin.  Recent a1c 5.9.  Follow sugars.  Has done well with diet and exercise.  Has lost weight.  Follow met b and a1c.

## 2021-09-20 NOTE — Assessment & Plan Note (Addendum)
Physical today 09/14/21.  Schedule mammogram.  Last 09/23/20 - Birads I.  Wants in [place].  Colonoscopy 06/03/21 - normal.  Recommended f/u colonoscopy in 10 years.  Sees gyn for pelvic and pap smears.

## 2021-09-20 NOTE — Assessment & Plan Note (Signed)
Low cholesterol diet and exercise.  On crestor.  Follow lipid panel and liver function tests.  

## 2021-10-11 ENCOUNTER — Ambulatory Visit
Admission: RE | Admit: 2021-10-11 | Discharge: 2021-10-11 | Disposition: A | Discharge status: Home / Self Care | Payer: BC Managed Care – PPO | Source: Ambulatory Visit | Attending: Internal Medicine | Admitting: Internal Medicine

## 2021-10-11 DIAGNOSIS — Z1231 Encounter for screening mammogram for malignant neoplasm of breast: Secondary | ICD-10-CM | POA: Insufficient documentation

## 2021-10-13 ENCOUNTER — Encounter (INDEPENDENT_AMBULATORY_CARE_PROVIDER_SITE_OTHER): Payer: Self-pay

## 2021-10-14 ENCOUNTER — Encounter: Payer: Self-pay | Admitting: Internal Medicine

## 2021-10-19 ENCOUNTER — Other Ambulatory Visit: Payer: Self-pay | Admitting: Internal Medicine

## 2021-10-19 DIAGNOSIS — E119 Type 2 diabetes mellitus without complications: Secondary | ICD-10-CM

## 2021-11-04 LAB — HM PAP SMEAR: HM Pap smear: NEGATIVE

## 2021-11-10 LAB — HM DIABETES EYE EXAM

## 2021-11-18 ENCOUNTER — Encounter: Payer: Self-pay | Admitting: Internal Medicine

## 2021-11-18 ENCOUNTER — Other Ambulatory Visit: Payer: Self-pay | Admitting: Internal Medicine

## 2021-11-18 DIAGNOSIS — E119 Type 2 diabetes mellitus without complications: Secondary | ICD-10-CM

## 2021-11-18 NOTE — Telephone Encounter (Signed)
Rx sent patient notified 

## 2021-11-23 IMAGING — DX DG FOOT COMPLETE 3+V*L*
3 series · 3 of 3 positions shown · non-contrast
Comparison: None.

CLINICAL DATA: Left foot pain

EXAM:
LEFT FOOT - COMPLETE 3+ VIEW

[foot ap]
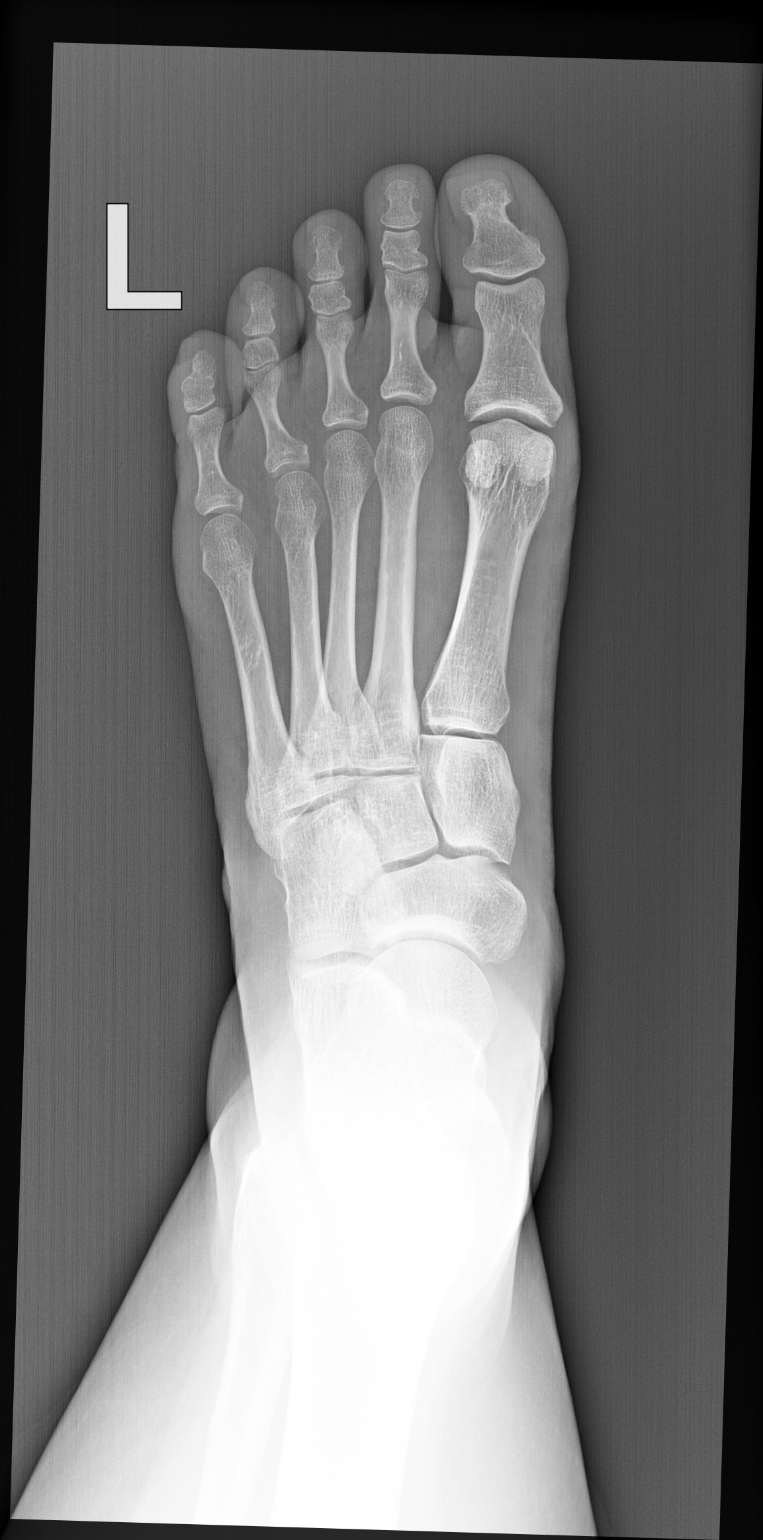

[foot obl (oblique)]
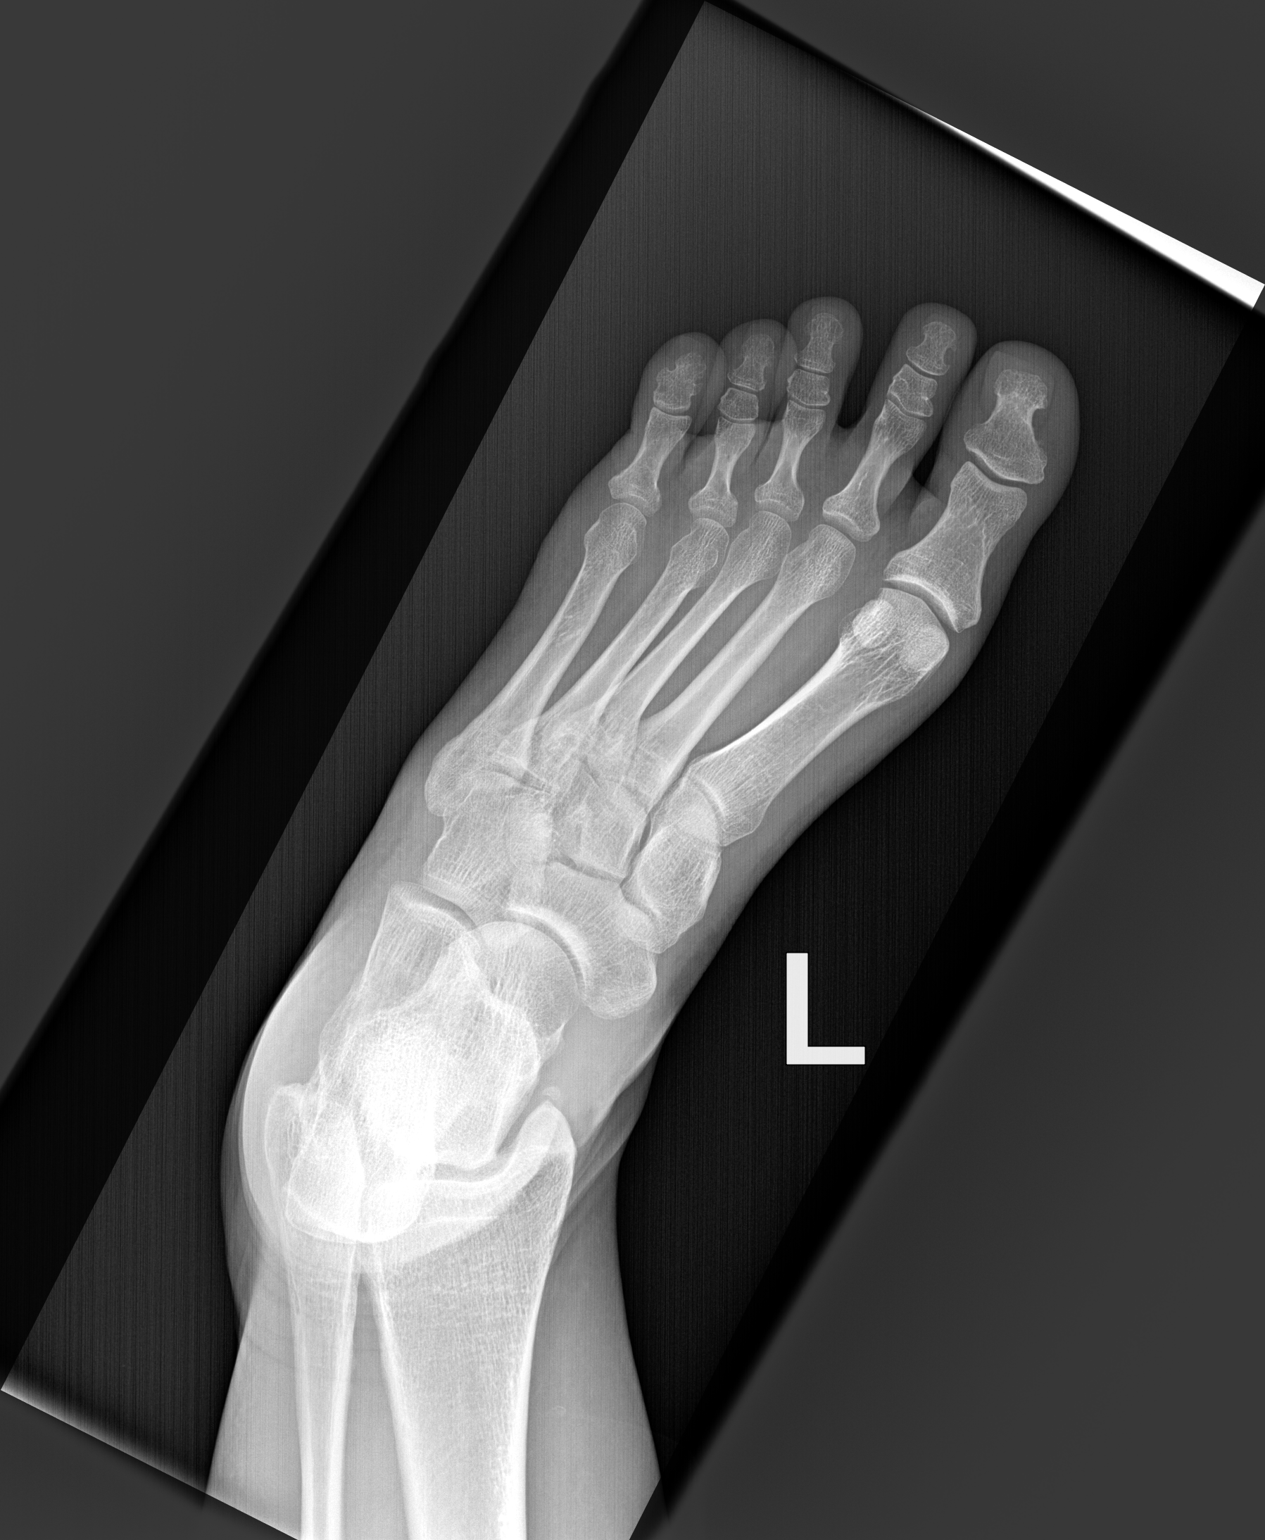

[foot lat]
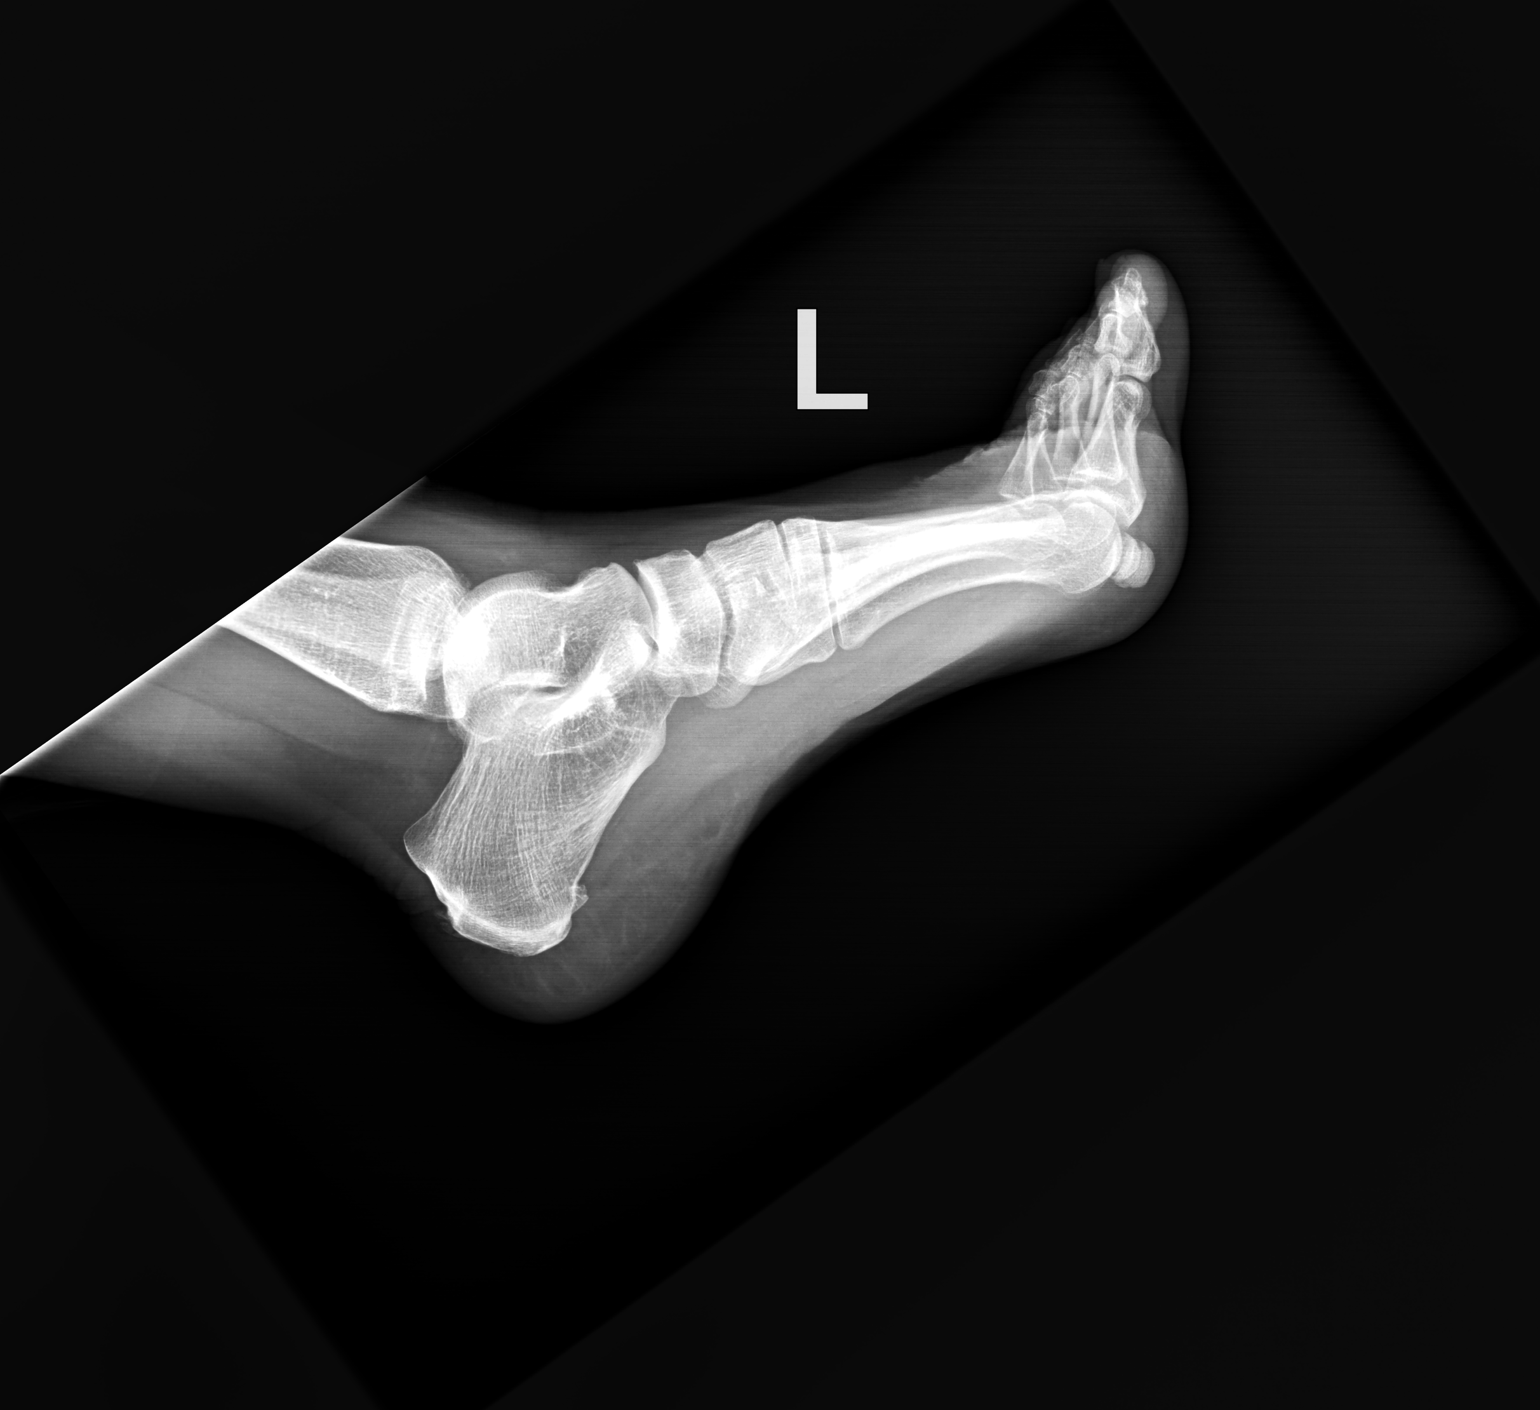

[3 of 3 positions shown; findings below may reference images not displayed]

FINDINGS: There is no evidence of fracture or dislocation. There is no
evidence of arthropathy or other focal bone abnormality. Soft
tissues are unremarkable.
IMPRESSION: Negative.

## 2021-12-24 ENCOUNTER — Other Ambulatory Visit: Payer: Self-pay | Admitting: Internal Medicine

## 2021-12-24 DIAGNOSIS — E119 Type 2 diabetes mellitus without complications: Secondary | ICD-10-CM

## 2021-12-29 ENCOUNTER — Encounter: Payer: Self-pay | Admitting: Internal Medicine

## 2022-01-12 ENCOUNTER — Other Ambulatory Visit (INDEPENDENT_AMBULATORY_CARE_PROVIDER_SITE_OTHER): Payer: BC Managed Care – PPO

## 2022-01-12 DIAGNOSIS — E1165 Type 2 diabetes mellitus with hyperglycemia: Secondary | ICD-10-CM | POA: Diagnosis not present

## 2022-01-12 DIAGNOSIS — E785 Hyperlipidemia, unspecified: Secondary | ICD-10-CM | POA: Diagnosis not present

## 2022-01-12 LAB — LIPID PANEL
Cholesterol: 198 mg/dL (ref 0–200)
HDL: 50.3 mg/dL (ref >39.00)
LDL Cholesterol: 126 mg/dL — ABNORMAL HIGH (ref 0–99)
NonHDL: 147.37
Total CHOL/HDL Ratio: 4
Triglycerides: 108 mg/dL (ref 0.0–149.0)
VLDL: 21.6 mg/dL (ref 0.0–40.0)

## 2022-01-12 LAB — HEPATIC FUNCTION PANEL
ALT: 9 U/L (ref 0–35)
AST: 12 U/L (ref 0–37)
Albumin: 3.9 g/dL (ref 3.5–5.2)
Alkaline Phosphatase: 72 U/L (ref 39–117)
Bilirubin, Direct: 0.1 mg/dL (ref 0.0–0.3)
Total Bilirubin: 0.4 mg/dL (ref 0.2–1.2)
Total Protein: 6.8 g/dL (ref 6.0–8.3)

## 2022-01-12 LAB — CBC WITH DIFFERENTIAL/PLATELET
Basophils Absolute: 0 10*3/uL (ref 0.0–0.1)
Basophils Relative: 0.5 % (ref 0.0–3.0)
Eosinophils Absolute: 0.2 10*3/uL (ref 0.0–0.7)
Eosinophils Relative: 2.4 % (ref 0.0–5.0)
HCT: 41.3 % (ref 36.0–46.0)
Hemoglobin: 13.6 g/dL (ref 12.0–15.0)
Lymphocytes Relative: 45.4 % (ref 12.0–46.0)
Lymphs Abs: 3.3 10*3/uL (ref 0.7–4.0)
MCHC: 32.9 g/dL (ref 30.0–36.0)
MCV: 91.9 fl (ref 78.0–100.0)
Monocytes Absolute: 0.5 10*3/uL (ref 0.1–1.0)
Monocytes Relative: 6.3 % (ref 3.0–12.0)
Neutro Abs: 3.3 10*3/uL (ref 1.4–7.7)
Neutrophils Relative %: 45.4 % (ref 43.0–77.0)
Platelets: 244 10*3/uL (ref 150.0–400.0)
RBC: 4.49 Mil/uL (ref 3.87–5.11)
RDW: 14 % (ref 11.5–15.5)
WBC: 7.2 10*3/uL (ref 4.0–10.5)

## 2022-01-12 LAB — BASIC METABOLIC PANEL
BUN: 13 mg/dL (ref 6–23)
CO2: 27 mEq/L (ref 19–32)
Calcium: 8.8 mg/dL (ref 8.4–10.5)
Chloride: 105 mEq/L (ref 96–112)
Creatinine, Ser: 0.74 mg/dL (ref 0.40–1.20)
GFR: 96.56 mL/min (ref >60.00)
Glucose, Bld: 108 mg/dL — ABNORMAL HIGH (ref 70–99)
Potassium: 4.3 mEq/L (ref 3.5–5.1)
Sodium: 139 mEq/L (ref 135–145)

## 2022-01-12 LAB — MICROALBUMIN / CREATININE URINE RATIO
Creatinine,U: 130.9 mg/dL
Microalb Creat Ratio: 0.6 mg/g (ref 0.0–30.0)
Microalb, Ur: 0.8 mg/dL (ref 0.0–1.9)

## 2022-01-12 LAB — HEMOGLOBIN A1C: Hgb A1c MFr Bld: 5.9 % (ref 4.6–6.5)

## 2022-01-12 LAB — TSH: TSH: 2.16 u[IU]/mL (ref 0.35–5.50)

## 2022-01-17 ENCOUNTER — Ambulatory Visit (INDEPENDENT_AMBULATORY_CARE_PROVIDER_SITE_OTHER): Payer: BC Managed Care – PPO | Admitting: Internal Medicine

## 2022-01-17 ENCOUNTER — Encounter: Payer: Self-pay | Admitting: Internal Medicine

## 2022-01-17 VITALS — BP 112/82 | HR 87 | Temp 98.1°F | Resp 16 | Ht 65.0 in | Wt 199.2 lb

## 2022-01-17 DIAGNOSIS — M25552 Pain in left hip: Secondary | ICD-10-CM

## 2022-01-17 DIAGNOSIS — B977 Papillomavirus as the cause of diseases classified elsewhere: Secondary | ICD-10-CM

## 2022-01-17 DIAGNOSIS — E785 Hyperlipidemia, unspecified: Secondary | ICD-10-CM

## 2022-01-17 DIAGNOSIS — E1165 Type 2 diabetes mellitus with hyperglycemia: Secondary | ICD-10-CM

## 2022-01-17 DIAGNOSIS — Z1211 Encounter for screening for malignant neoplasm of colon: Secondary | ICD-10-CM | POA: Diagnosis not present

## 2022-01-17 DIAGNOSIS — M25551 Pain in right hip: Secondary | ICD-10-CM | POA: Diagnosis not present

## 2022-01-17 DIAGNOSIS — F439 Reaction to severe stress, unspecified: Secondary | ICD-10-CM

## 2022-01-17 DIAGNOSIS — Z23 Encounter for immunization: Secondary | ICD-10-CM

## 2022-01-17 MED ORDER — PRAVASTATIN SODIUM 10 MG PO TABS: ORAL_TABLET | ORAL | 1 refills | Status: DC

## 2022-01-17 NOTE — Progress Notes (Signed)
Patient ID: Colleen Wagner, female   DOB: January 15, 1976, 46 y.o.   MRN: [id number]   Subjective:    Patient ID: Colleen Wagner, female    DOB: 02/15/1976, 46 y.o.   MRN: [id number]   Patient here for  Chief Complaint  Patient presents with   Diabetes   .   HPI Here to follow up regarding diabetes and weight management.  Has had problems getting mounjaro.  Letter has been sent.  Has done well on mounjaro.  Tries to stay active.  Doing intermittent fasting.  Hips better. Saw gyn 11/04/21 - annual. PAP.  Doing better off metformin.  Increased stress.  Discussed.  Also discussed recent labs.    Past Medical History:  Diagnosis Date   Chicken pox    Diabetes mellitus without complication ([place])    Past Surgical History:  Procedure Laterality Date   COLONOSCOPY WITH PROPOFOL N/A 06/03/2021   Procedure: COLONOSCOPY WITH PROPOFOL;  Surgeon: Lin Landsman, MD;  Location: [facility] ENDOSCOPY;  Service: Gastroenterology;  Laterality: N/A;   GALLBLADDER SURGERY     KNEE ARTHROSCOPY Right 2005   Family History  Problem Relation Age of Onset   Hyperlipidemia Mother    Hypertension Father    Diabetes Father    Stroke Father    Miscarriages / [place] Sister    Arthritis Paternal Grandmother    Diabetes Paternal Grandmother    Breast cancer Neg Hx    Social History   Socioeconomic History   Marital status: Single    Spouse name: Not on file   Number of children: Not on file   Years of education: Not on file   Highest education level: Bachelor's degree (e.g., BA, AB, BS)  Occupational History   Not on file  Tobacco Use   Smoking status: Former    Packs/day: 0.50    Years: 25.00    Total pack years: 12.50    Types: Cigarettes    Quit date: 09/2020    Years since quitting: 1.4   Smokeless tobacco: Never   Tobacco comments:    Enjoys smoking  Vaping Use   Vaping Use: Every day  Substance and Sexual Activity   Alcohol use: Not Currently    Comment: rarely   Drug use:  Never   Sexual activity: Yes    Birth control/protection: I.U.D.  Other Topics Concern   Not on file  Social History Narrative   No married with 2 sons and works [employer]   Social Determinants of Radio broadcast assistant Strain: Low Risk  (02/05/2021)   Overall Financial Resource Strain (CARDIA)    Difficulty of Paying Living Expenses: Not hard at all  Food Insecurity: Not on file  Transportation Needs: Not on file  Physical Activity: Not on file  Stress: Not on file  Social Connections: Not on file     Review of Systems  Constitutional:  Negative for appetite change and unexpected weight change.  HENT:  Negative for congestion and sinus pressure.   Respiratory:  Negative for cough, chest tightness and shortness of breath.   Cardiovascular:  Negative for chest pain, palpitations and leg swelling.  Gastrointestinal:  Negative for abdominal pain, nausea and vomiting.  Genitourinary:  Negative for difficulty urinating and dysuria.  Musculoskeletal:  Negative for joint swelling and myalgias.  Skin:  Negative for color change and rash.  Neurological:  Negative for dizziness and headaches.  Psychiatric/Behavioral:  Negative for agitation.  Increased stress.        Objective:     BP 112/82   Pulse 87   Temp 98.1 F (36.7 C) (Temporal)   Resp 16   Ht _0  (1.651 m)   Wt 199 lb 3.2 oz (90.4 kg)   SpO2 99%   BMI 33.15 kg/m  Wt Readings from Last 3 Encounters:  01/17/22 199 lb 3.2 oz (90.4 kg)  09/14/21 207 lb (93.9 kg)  07/16/21 212 lb (96.2 kg)    Physical Exam Vitals reviewed.  Constitutional:      General: She is not in acute distress.    Appearance: Normal appearance.  HENT:     Head: Normocephalic and atraumatic.     Right Ear: External ear normal.     Left Ear: External ear normal.  Eyes:     General: No scleral icterus.       Right eye: No discharge.        Left eye: No discharge.     Conjunctiva/sclera: Conjunctivae normal.  Neck:      Thyroid: No thyromegaly.  Cardiovascular:     Rate and Rhythm: Normal rate and regular rhythm.  Pulmonary:     Effort: No respiratory distress.     Breath sounds: Normal breath sounds. No wheezing.  Abdominal:     General: Bowel sounds are normal.     Palpations: Abdomen is soft.     Tenderness: There is no abdominal tenderness.  Musculoskeletal:        General: No swelling or tenderness.     Cervical back: Neck supple. No tenderness.  Lymphadenopathy:     Cervical: No cervical adenopathy.  Skin:    Findings: No erythema or rash.  Neurological:     Mental Status: She is alert.  Psychiatric:        Mood and Affect: Mood normal.        Behavior: Behavior normal.      Outpatient Encounter Medications as of 01/17/2022  Medication Sig   blood glucose meter kit and supplies Dispense based on patient and insurance preference. Use up to four times daily as directed. (FOR ICD-10 E10.9, E11.9).   levonorgestrel (MIRENA) 20 MCG/24HR IUD by Intrauterine route.   MOUNJARO 7.5 MG/0.5ML Pen INJECT 7.5MG SUBCUTANEOUSLY ONCE A WEEK   pravastatin (PRAVACHOL) 10 MG tablet Take one tablet q Monday, Wednesday and Friday.   [DISCONTINUED] naproxen sodium (ANAPROX DS) 550 MG tablet Take 1 tablet (550 mg total) by mouth 2 (two) times daily with a meal.   [DISCONTINUED] famotidine (PEPCID) 20 MG tablet Take 20 mg by mouth daily.   [DISCONTINUED] metFORMIN (GLUCOPHAGE-XR) 500 MG 24 hr tablet TAKE 1 TABLET TWICE A DAY   [DISCONTINUED] rosuvastatin (CRESTOR) 5 MG tablet Take 1 tablet (5 mg total) by mouth daily.   No facility-administered encounter medications on file as of 01/17/2022.     Lab Results  Component Value Date   WBC 7.2 01/12/2022   HGB 13.6 01/12/2022   HCT 41.3 01/12/2022   PLT 244.0 01/12/2022   GLUCOSE 108 (H) 01/12/2022   CHOL 198 01/12/2022   TRIG 108.0 01/12/2022   HDL 50.30 01/12/2022   LDLCALC 126 (H) 01/12/2022   ALT 9 01/12/2022   AST 12 01/12/2022   NA 139  01/12/2022   K 4.3 01/12/2022   CL 105 01/12/2022   CREATININE 0.74 01/12/2022   BUN 13 01/12/2022   CO2 27 01/12/2022   TSH 2.16 01/12/2022   HGBA1C 5.9 01/12/2022  MICROALBUR 0.8 01/12/2022    MM 3D SCREEN BREAST BILATERAL  Result Date: 10/12/2021 CLINICAL DATA:  Screening. EXAM: DIGITAL SCREENING BILATERAL MAMMOGRAM WITH TOMOSYNTHESIS AND CAD TECHNIQUE: Bilateral screening digital craniocaudal and mediolateral oblique mammograms were obtained. Bilateral screening digital breast tomosynthesis was performed. The images were evaluated with computer-aided detection. COMPARISON:  Previous exam(s). ACR Breast Density Category b: There are scattered areas of fibroglandular density. FINDINGS: There are no findings suspicious for malignancy. IMPRESSION: No mammographic evidence of malignancy. A result letter of this screening mammogram will be mailed directly to the patient. RECOMMENDATION: Screening mammogram in one year. (Code:SM-B-01Y) BI-RADS CATEGORY  1: Negative. Electronically Signed   By: Dorise Bullion III M.D.   On: 10/12/2021 14:12       Assessment & Plan:   Problem List Items Addressed This Visit     Colon cancer screening    Colonoscopy 05/2021 - norma.  Recommended f/u in 10 years.       Hip pain    Hips doing better.  Follow.       HPV in female    Saw gyn 11/04/21 - annual pap.       Hyperlipidemia    Low cholesterol diet and exercise.  Previous trial of crestor.  Discussed recent cholesterol results.  Agreeable to trial of low dose pravastatin.  Follow lipid panel and liver function tests.       Relevant Medications   pravastatin (PRAVACHOL) 10 MG tablet   Other Relevant Orders   Hepatic function panel   Stress    Increased stress with her son's health issues.  Discussed.  Does not feel needs any further intervention.  Follow.        Type 2 diabetes mellitus with hyperglycemia (HCC) - Primary    On mounjaro - 7.19m.  off metformin.  Bowels better.  Recent a1c  5.9.  Follow sugars.  Has done well with diet and exercise.  Has lost weight.  Follow met b and a1c.  Letter sent for mounjaro.        Relevant Medications   pravastatin (PRAVACHOL) 10 MG tablet   Other Visit Diagnoses     Need for influenza vaccination       Relevant Orders   Flu Vaccine QUAD 6+ mos PF IM (Fluarix Quad PF)        CEinar Pheasant MD

## 2022-01-30 ENCOUNTER — Telehealth: Payer: Self-pay | Admitting: Internal Medicine

## 2022-01-30 ENCOUNTER — Encounter: Payer: Self-pay | Admitting: Internal Medicine

## 2022-01-30 NOTE — Assessment & Plan Note (Signed)
Saw gyn 11/04/21 - annual pap.

## 2022-01-30 NOTE — Assessment & Plan Note (Addendum)
On mounjaro - 7.69m.  off metformin.  Bowels better.  Recent a1c 5.9.  Follow sugars.  Has done well with diet and exercise.  Has lost weight.  Follow met b and a1c.  Letter sent for mounjaro.

## 2022-01-30 NOTE — Telephone Encounter (Signed)
Need to confirm and document if received flu vaccine - at her appt.

## 2022-01-30 NOTE — Assessment & Plan Note (Signed)
Low cholesterol diet and exercise.  Previous trial of crestor.  Discussed recent cholesterol results.  Agreeable to trial of low dose pravastatin.  Follow lipid panel and liver function tests.

## 2022-01-30 NOTE — Assessment & Plan Note (Signed)
Colonoscopy 05/2021 - norma.  Recommended f/u in 10 years.

## 2022-01-30 NOTE — Assessment & Plan Note (Signed)
Increased stress with her son's health issues.  Discussed.  Does not feel needs any further intervention.  Follow.

## 2022-01-30 NOTE — Assessment & Plan Note (Signed)
Hips doing better.  Follow.

## 2022-02-03 ENCOUNTER — Ambulatory Visit
Admission: RE | Admit: 2022-02-03 | Discharge: 2022-02-03 | Disposition: A | Discharge status: Home / Self Care | Payer: BC Managed Care – PPO | Source: Ambulatory Visit | Attending: Urgent Care | Admitting: Urgent Care

## 2022-02-03 VITALS — BP 138/91 | HR 100 | Temp 99.1°F | Resp 17

## 2022-02-03 DIAGNOSIS — R6889 Other general symptoms and signs: Secondary | ICD-10-CM | POA: Diagnosis not present

## 2022-02-03 DIAGNOSIS — J029 Acute pharyngitis, unspecified: Secondary | ICD-10-CM | POA: Diagnosis not present

## 2022-02-03 LAB — POCT RAPID STREP A (OFFICE): Rapid Strep A Screen: NEGATIVE

## 2022-02-03 NOTE — ED Provider Notes (Signed)
[facility]    CSN: [id number] Arrival date & time: 02/03/22  1058      History   Chief Complaint Chief Complaint  Patient presents with   Sore Throat    Feels like I am swallowing shards of glass - Entered by patient   Cough    HPI Colleen Wagner is a 46 y.o. female.   HPI  Presents to urgent care with complaint of sore throat, cough, chills starting 3 days ago.  She endorses very painful swallow.  Past Medical History:  Diagnosis Date   Chicken pox    Diabetes mellitus without complication 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      Home Medications    Prior to Admission medications   Medication Sig Start Date End Date Taking? Authorizing Provider  blood glucose meter kit and supplies Dispense based on patient and insurance preference. Use up to four times daily as directed. (FOR ICD-10 E10.9, E11.9). 11/10/19   Marval Regal, NP  levonorgestrel (MIRENA) 20 MCG/24HR IUD by Intrauterine route.    [provider]  MOUNJARO  7.5 MG/0.5ML Pen INJECT 7.5MG SUBCUTANEOUSLY ONCE A WEEK 12/24/21   Einar Pheasant, MD  pravastatin (PRAVACHOL) 10 MG tablet Take one tablet q Monday, Wednesday and Friday. 01/17/22   Einar Pheasant, MD    Family History Family History  Problem Relation Age of Onset   Hyperlipidemia Mother    Hypertension Father    Diabetes Father    Stroke Father    Miscarriages / [place] Sister    Arthritis Paternal Grandmother    Diabetes Paternal Grandmother    Breast cancer Neg Hx     Social History Social History   Tobacco Use   Smoking status: Former    Packs/day: 0.50    Years: 25.00    Total pack years: 12.50    Types: Cigarettes    Quit date: 09/2020    Years since quitting: 1.4   Smokeless tobacco: Never   Tobacco comments:    Enjoys smoking  Vaping Use   Vaping Use: Every day  Substance Use Topics   Alcohol use: Not Currently    Comment: rarely   Drug use: Never     Allergies   Patient has no known allergies.   Review of Systems Review of Systems   Physical Exam Triage Vital Signs ED Triage Vitals  Enc Vitals Group     BP 02/03/22 1125 (!) 138/91     Pulse Rate 02/03/22 1125 100     Resp 02/03/22  1125 17     Temp 02/03/22 1125 99.1 F (37.3 C)     Temp src --      SpO2 02/03/22 1125 97 %     Weight --      Height --      Head Circumference --      Peak Flow --      Pain Score 02/03/22 1127 9     Pain Loc --      Pain Edu? --      Excl. in [place]? --    No data found.  Updated Vital Signs BP (!) 138/91   Pulse 100   Temp 99.1 F (37.3 C)   Resp 17   SpO2 97%   Visual Acuity Right Eye Distance:   Left Eye Distance:   Bilateral Distance:    Right Eye Near:   Left Eye Near:    Bilateral Near:     Physical Exam   UC Treatments / Results  Labs (all labs ordered are listed, but only abnormal results are displayed) Labs Reviewed  POCT RAPID STREP A (OFFICE)    EKG   Radiology No results found.  Procedures Procedures  (including critical care time)  Medications Ordered in UC Medications - No data to display  Initial Impression / Assessment and Plan / UC Course  I have reviewed the triage vital signs and the nursing notes.  Pertinent labs & imaging results that were available during my care of the patient were reviewed by me and considered in my medical decision making (see chart for details).   Rapid strep is negative.  Presumed viral pharyngitis.  Given her symptom of chills, possibly flu like illness.    Recommending OTC medication for symptom control including ibuprofen for throat pain.  Recommending frequent salt water gargles.   Final Clinical Impressions(s) / UC Diagnoses   Final diagnoses:  None   Discharge Instructions   None    ED Prescriptions   None    PDMP not reviewed this encounter.   Rose Phi, Organ 02/03/22 1203

## 2022-02-03 NOTE — ED Triage Notes (Signed)
Pt. Presents to [facility] w/ c/o a sore throat, cough and chills that started 3 days ago.

## 2022-02-03 NOTE — Discharge Instructions (Signed)
Recommending OTC medication for symptom control as needed.  Follow up here or with your primary care provider if your symptoms are worsening or not improving.

## 2022-02-07 NOTE — Telephone Encounter (Signed)
L/M FOR PT. TO C/B.

## 2022-02-21 ENCOUNTER — Encounter: Payer: Self-pay | Admitting: Internal Medicine

## 2022-02-24 ENCOUNTER — Other Ambulatory Visit (INDEPENDENT_AMBULATORY_CARE_PROVIDER_SITE_OTHER): Payer: BC Managed Care – PPO

## 2022-02-24 DIAGNOSIS — E785 Hyperlipidemia, unspecified: Secondary | ICD-10-CM | POA: Diagnosis not present

## 2022-02-24 LAB — HEPATIC FUNCTION PANEL
ALT: 10 U/L (ref 0–35)
AST: 13 U/L (ref 0–37)
Albumin: 3.8 g/dL (ref 3.5–5.2)
Alkaline Phosphatase: 79 U/L (ref 39–117)
Bilirubin, Direct: 0.1 mg/dL (ref 0.0–0.3)
Total Bilirubin: 0.4 mg/dL (ref 0.2–1.2)
Total Protein: 7.1 g/dL (ref 6.0–8.3)

## 2022-03-03 ENCOUNTER — Other Ambulatory Visit (HOSPITAL_COMMUNITY): Payer: Self-pay

## 2022-05-12 ENCOUNTER — Telehealth: Payer: Self-pay | Admitting: Internal Medicine

## 2022-05-12 DIAGNOSIS — E785 Hyperlipidemia, unspecified: Secondary | ICD-10-CM

## 2022-05-12 DIAGNOSIS — E1165 Type 2 diabetes mellitus with hyperglycemia: Secondary | ICD-10-CM

## 2022-05-12 NOTE — Telephone Encounter (Signed)
Patient has a lab appt 05/16/2022, there are No orders in.

## 2022-05-12 NOTE — Telephone Encounter (Signed)
Orders placed.

## 2022-05-12 NOTE — Addendum Note (Signed)
Addended by: Roetta Sessions D on: 05/12/2022 02:23 PM   Modules accepted: Orders

## 2022-05-16 ENCOUNTER — Other Ambulatory Visit (INDEPENDENT_AMBULATORY_CARE_PROVIDER_SITE_OTHER): Payer: BC Managed Care – PPO

## 2022-05-16 DIAGNOSIS — E1165 Type 2 diabetes mellitus with hyperglycemia: Secondary | ICD-10-CM | POA: Diagnosis not present

## 2022-05-16 DIAGNOSIS — E785 Hyperlipidemia, unspecified: Secondary | ICD-10-CM | POA: Diagnosis not present

## 2022-05-16 LAB — BASIC METABOLIC PANEL
BUN: 13 mg/dL (ref 6–23)
CO2: 27 mEq/L (ref 19–32)
Calcium: 9.2 mg/dL (ref 8.4–10.5)
Chloride: 104 mEq/L (ref 96–112)
Creatinine, Ser: 0.84 mg/dL (ref 0.40–1.20)
GFR: 82.74 mL/min (ref >60.00)
Glucose, Bld: 122 mg/dL — ABNORMAL HIGH (ref 70–99)
Potassium: 4.1 mEq/L (ref 3.5–5.1)
Sodium: 142 mEq/L (ref 135–145)

## 2022-05-16 LAB — HEPATIC FUNCTION PANEL
ALT: 10 U/L (ref 0–35)
AST: 14 U/L (ref 0–37)
Albumin: 3.7 g/dL (ref 3.5–5.2)
Alkaline Phosphatase: 74 U/L (ref 39–117)
Bilirubin, Direct: 0.1 mg/dL (ref 0.0–0.3)
Total Bilirubin: 0.4 mg/dL (ref 0.2–1.2)
Total Protein: 7.1 g/dL (ref 6.0–8.3)

## 2022-05-16 LAB — LIPID PANEL
Cholesterol: 189 mg/dL (ref 0–200)
HDL: 52.5 mg/dL (ref >39.00)
LDL Cholesterol: 118 mg/dL — ABNORMAL HIGH (ref 0–99)
NonHDL: 136.41
Total CHOL/HDL Ratio: 4
Triglycerides: 93 mg/dL (ref 0.0–149.0)
VLDL: 18.6 mg/dL (ref 0.0–40.0)

## 2022-05-16 LAB — HEMOGLOBIN A1C: Hgb A1c MFr Bld: 6.6 % — ABNORMAL HIGH (ref 4.6–6.5)

## 2022-05-19 ENCOUNTER — Ambulatory Visit: Payer: BC Managed Care – PPO | Admitting: Internal Medicine

## 2022-05-19 VITALS — BP 120/76 | HR 87 | Temp 98.0°F | Resp 16 | Ht 65.0 in | Wt 218.0 lb

## 2022-05-19 DIAGNOSIS — B977 Papillomavirus as the cause of diseases classified elsewhere: Secondary | ICD-10-CM | POA: Diagnosis not present

## 2022-05-19 DIAGNOSIS — Z1211 Encounter for screening for malignant neoplasm of colon: Secondary | ICD-10-CM

## 2022-05-19 DIAGNOSIS — E1165 Type 2 diabetes mellitus with hyperglycemia: Secondary | ICD-10-CM | POA: Diagnosis not present

## 2022-05-19 DIAGNOSIS — Z1231 Encounter for screening mammogram for malignant neoplasm of breast: Secondary | ICD-10-CM

## 2022-05-19 DIAGNOSIS — E785 Hyperlipidemia, unspecified: Secondary | ICD-10-CM | POA: Diagnosis not present

## 2022-05-19 DIAGNOSIS — F439 Reaction to severe stress, unspecified: Secondary | ICD-10-CM

## 2022-05-19 DIAGNOSIS — Z23 Encounter for immunization: Secondary | ICD-10-CM

## 2022-05-19 MED ORDER — PRAVASTATIN SODIUM 10 MG PO TABS: 10.0000 mg | ORAL_TABLET | Freq: Every day | ORAL | 3 refills | Status: DC

## 2022-05-19 MED ORDER — TIRZEPATIDE 2.5 MG/0.5ML ~~LOC~~ SOAJ: 2.5000 mg | SUBCUTANEOUS | 2 refills | Status: DC

## 2022-05-19 NOTE — Progress Notes (Signed)
Subjective:    Patient ID: Colleen Wagner, female    DOB: [birth year], 47 y.o.   MRN: [id number]  Patient here for  Chief Complaint  Patient presents with   Medical Management of Chronic Issues    HPI Here to follow up regarding hypercholesterolemia, diabetes and weight management. Started on pravastatin last visit.  Taking three days per week and tolerating.  Discussed increasing to daily pravastatin.  Off mounjaro since last visit.  Insurance declined.  Off metformin.  Intolerance. A1c increased 6.6. Discussed restarting mounjaro.  Increased stress. She feels she is handling things relatively well.  Some issues with constipation.  Discussed. Especially since starting back on mounjaro.  Discussed benefiber.  Breathing stable.  No increased cough or congestion.     Past Medical History:  Diagnosis Date   Chicken pox    Diabetes mellitus without complication ([place])    Past Surgical History:  Procedure Laterality Date   COLONOSCOPY WITH PROPOFOL N/A 06/03/2021   Procedure: COLONOSCOPY WITH PROPOFOL;  Surgeon: Lin Landsman, MD;  Location[facility] END[facility]COPY;  Service: Gastroenterology;  Laterality: N/A;   GALLBLADDER SURGERY     KNEE ARTHROSCOPY Right 2005   Family History  Problem Relation Age of Onset   Hyperlipidemia Mother    Hypertension Father    Diabetes Father    Stroke Father    Miscarriages / [place] Sister    Arthritis Paternal Grandmother    Diabetes Paternal Grandmother    Breast cancer Neg Hx    Social History   Socioeconomic History   Marital status: Single    Spouse name: Not on file   Number of children: Not on file   Years of education: Not on file   Highest education level: Bachelor's degree (e.g., BA, AB, BS)  Occupational History   Not on file  Tobacco Use   Smoking status: Former    Packs/day: 0.50    Years: 25.00    Additional pack years: 0.00    Total pack years: 12.50    Types: Cigarettes    Quit date: 09/2020    Years since  quitting: 1.7   Smokeless tobacco: Never   Tobacco comments:    Enjoys smoking  Vaping Use   Vaping Use: Every day  Substance and Sexual Activity   Alcohol use: Not Currently    Comment: rarely   Drug use: Never   Sexual activity: Yes    Birth control/protection: I.U.D.  Other Topics Concern   Not on file  Social History Narrative   No married with 2 sons and works [employer]   Social Determinants of Radio broadcast assistant Strain: Low Risk  (02/05/2021)   Overall Financial Resource Strain (CARDIA)    Difficulty of Paying Living Expenses: Not hard at all  Food Insecurity: Not on file  Transportation Needs: Not on file  Physical Activity: Not on file  Stress: Not on file  Social Connections: Not on file     Review of Systems  Constitutional:  Negative for appetite change and unexpected weight change.  HENT:  Negative for congestion and sinus pressure.   Respiratory:  Negative for cough, chest tightness and shortness of breath.   Cardiovascular:  Negative for chest pain and palpitations.  Gastrointestinal:  Positive for constipation. Negative for abdominal pain, nausea and vomiting.  Genitourinary:  Negative for difficulty urinating and dysuria.  Musculoskeletal:  Negative for joint swelling and myalgias.  Skin:  Negative for color change and rash.  Neurological:  Negative for dizziness and headaches.  Psychiatric/Behavioral:  Negative for agitation and dysphoric mood.        Increased stress.        Objective:     BP 120/76   Pulse 87   Temp 98 F (36.7 C)   Resp 16   Ht 5\' 5"  (1.651 m)   Wt 218 lb (98.9 kg)   SpO2 99%   BMI 36.28 kg/m  Wt Readings from Last 3 Encounters:  05/19/22 218 lb (98.9 kg)  01/17/22 199 lb 3.2 oz (90.4 kg)  09/14/21 207 lb (93.9 kg)    Physical Exam Vitals reviewed.  Constitutional:      General: She is not in acute distress.    Appearance: Normal appearance.  HENT:     Head: Normocephalic and atraumatic.      Right Ear: External ear normal.     Left Ear: External ear normal.  Eyes:     General: No scleral icterus.       Right eye: No discharge.        Left eye: No discharge.     Conjunctiva/sclera: Conjunctivae normal.  Neck:     Thyroid: No thyromegaly.  Cardiovascular:     Rate and Rhythm: Normal rate and regular rhythm.  Pulmonary:     Effort: No respiratory distress.     Breath sounds: Normal breath sounds. No wheezing.  Abdominal:     General: Bowel sounds are normal.     Palpations: Abdomen is soft.     Tenderness: There is no abdominal tenderness.  Musculoskeletal:        General: No swelling or tenderness.     Cervical back: Neck supple. No tenderness.  Lymphadenopathy:     Cervical: No cervical adenopathy.  Skin:    Findings: No erythema or rash.  Neurological:     Mental Status: She is alert.  Psychiatric:        Mood and Affect: Mood normal.        Behavior: Behavior normal.      Outpatient Encounter Medications as of 05/19/2022  Medication Sig   tirzepatide (MOUNJARO) 2.5 MG/0.5ML Pen Inject 2.5 mg into the skin once a week.   blood glucose meter kit and supplies Dispense based on patient and insurance preference. Use up to four times daily as directed. (FOR ICD-10 E10.9, E11.9).   levonorgestrel (MIRENA) 20 MCG/24HR IUD by Intrauterine route.   pravastatin (PRAVACHOL) 10 MG tablet Take 1 tablet (10 mg total) by mouth daily.   [DISCONTINUED] MOUNJARO 7.5 MG/0.5ML Pen INJECT 7.5MG  SUBCUTANEOUSLY ONCE A WEEK   [DISCONTINUED] pravastatin (PRAVACHOL) 10 MG tablet Take one tablet q Monday, Wednesday and Friday.   No facility-administered encounter medications on file as of 05/19/2022.     Lab Results  Component Value Date   WBC 7.2 01/12/2022   HGB 13.6 01/12/2022   HCT 41.3 01/12/2022   PLT 244.0 01/12/2022   GLUCOSE 122 (H) 05/16/2022   CHOL 189 05/16/2022   TRIG 93.0 05/16/2022   HDL 52.50 05/16/2022   LDLCALC 118 (H) 05/16/2022   ALT 10 05/16/2022   AST  14 05/16/2022   NA 142 05/16/2022   K 4.1 05/16/2022   CL 104 05/16/2022   CREATININE 0.84 05/16/2022   BUN 13 05/16/2022   CO2 27 05/16/2022   TSH 2.16 01/12/2022   HGBA1C 6.6 (H) 05/16/2022   MICROALBUR 0.8 01/12/2022    No results found.     Assessment & Plan:  Colon cancer screening Assessment & Plan: Colonoscopy 05/2021 - normal.  Recommended f/u in 10 years.    Need for immunization against influenza -     Flu Vaccine QUAD 46mo+IM (Fluarix, Fluzone & Alfiuria Quad PF)  Type 2 diabetes mellitus with hyperglycemia, without long-term current use of insulin (HCC) Assessment & Plan: Continue low carb diet and exercise.  Off mounjaro.  Restart.  Follow met b and A1c.    Orders: -     Hemoglobin A1c; Future  Hyperlipidemia, unspecified hyperlipidemia type Assessment & Plan: Low cholesterol diet and exercise.  Previous trial of crestor.  Discussed recent cholesterol results.  Tolerating pravastatin 3 days per week.  Increase to q day.  Follow lipid panel and liver function tests.   Orders: -     Lipid panel; Future -     Hepatic function panel; Future -     Basic metabolic panel; Future  Encounter for screening mammogram for malignant neoplasm of breast Assessment & Plan: Mammogram 10/11/21 - Birads I.    HPV in female Assessment & Plan: Saw gyn 11/04/21 - annual pap.    Stress Assessment & Plan: Increased stress with her son's health issues.  Overall appears to be handling things relatively well.  Does not feel needs any further intervention.  Follow.     Other orders -     Tirzepatide; Inject 2.5 mg into the skin once a week.  Dispense: 2 mL; Refill: 2 -     Pravastatin Sodium; Take 1 tablet (10 mg total) by mouth daily.  Dispense: 90 tablet; Refill: 3     Einar Pheasant, MD

## 2022-05-19 NOTE — Patient Instructions (Signed)
Benefiber - daily 

## 2022-05-23 ENCOUNTER — Encounter: Payer: Self-pay | Admitting: Internal Medicine

## 2022-05-23 NOTE — Assessment & Plan Note (Signed)
Low cholesterol diet and exercise.  Previous trial of crestor.  Discussed recent cholesterol results.  Tolerating pravastatin 3 days per week.  Increase to q day.  Follow lipid panel and liver function tests.

## 2022-05-23 NOTE — Assessment & Plan Note (Signed)
Continue low carb diet and exercise.  Off mounjaro.  Restart.  Follow met b and A1c.

## 2022-05-23 NOTE — Assessment & Plan Note (Signed)
Mammogram 10/11/21 - Birads I.

## 2022-05-23 NOTE — Assessment & Plan Note (Signed)
Saw gyn 11/04/21 - annual pap.

## 2022-05-23 NOTE — Assessment & Plan Note (Addendum)
Increased stress with her son's health issues.  Overall appears to be handling things relatively well.  Does not feel needs any further intervention.  Follow.

## 2022-05-23 NOTE — Assessment & Plan Note (Signed)
Colonoscopy 05/2021 - normal.  Recommended f/u in 10 years.

## 2022-07-22 ENCOUNTER — Encounter: Payer: Self-pay | Admitting: Internal Medicine

## 2022-07-25 MED ORDER — TIRZEPATIDE 5 MG/0.5ML ~~LOC~~ SOAJ: 5.0000 mg | SUBCUTANEOUS | 0 refills | Status: DC

## 2022-07-25 NOTE — Telephone Encounter (Signed)
Ok to increase her dose? She has been on the 2.5 mounjaro since 3/14.

## 2022-07-25 NOTE — Telephone Encounter (Signed)
Ok to increase to 5mg  since tolerating.

## 2022-08-18 ENCOUNTER — Other Ambulatory Visit: Payer: Self-pay | Admitting: Internal Medicine

## 2022-08-19 ENCOUNTER — Encounter: Payer: Self-pay | Admitting: Internal Medicine

## 2022-08-19 ENCOUNTER — Telehealth: Payer: BC Managed Care – PPO | Admitting: Internal Medicine

## 2022-08-19 VITALS — Ht 65.0 in | Wt 217.0 lb

## 2022-08-19 DIAGNOSIS — E1165 Type 2 diabetes mellitus with hyperglycemia: Secondary | ICD-10-CM | POA: Diagnosis not present

## 2022-08-19 DIAGNOSIS — E785 Hyperlipidemia, unspecified: Secondary | ICD-10-CM | POA: Diagnosis not present

## 2022-08-19 DIAGNOSIS — K219 Gastro-esophageal reflux disease without esophagitis: Secondary | ICD-10-CM

## 2022-08-19 NOTE — Progress Notes (Signed)
Patient ID: Colleen Wagner, female   DOB: [birth year], 47 y.o.   MRN: [id number]   Virtual Visit via video Note   All issues noted in this document were discussed and addressed.  No physical exam was performed (except for noted visual exam findings with Video Visits).   I connected with Colleen Wagner by a video enabled telemedicine application and verified that I am speaking with the correct person using two identifiers. Location patient: home Location provider: work Persons participating in the virtual visit: patient, provider  The limitations, risks, security and privacy concerns of performing an evaluation and management service by video and the availability of in person appointments have been discussed.  It has also been discussed with the patient that there may be a patient responsible charge related to this service. The patient expressed understanding and agreed to proceed.   Reason for visit: work in appt  HPI: Work in with concerns regarding side effects of mounjaro.  On 5mg  dose.  Reports increased nausea and diarrhea.  Indigestion is worse.  Symptoms worse at night.  Has urgency with her bowels.  At night, hard to get up fast enough and make it to the bathroom.  She has lost 7 pounds just recently due to the side effects.  Sis not notice as much previously when on mounjaro.  Has had the 5mg  dose for a couple of weeks.  Breathing stable.  Handling stress.     ROS: See pertinent positives and negatives per HPI.  Past Medical History:  Diagnosis Date   Chicken pox    Diabetes mellitus without complication (HCC)     Past Surgical History:  Procedure Laterality Date   COLONOSCOPY WITH PROPOFOL N/A 06/03/2021   Procedure: COLONOSCOPY WITH PROPOFOL;  Surgeon: Toney Reil, MD;  Location: [facility] ENDOSCOPY;  Service: Gastroenterology;  Laterality: N/A;   GALLBLADDER SURGERY     KNEE ARTHROSCOPY Right 2005    Family History  Problem Relation Age of Onset   Hyperlipidemia  Mother    Hypertension Father    Diabetes Father    Stroke Father    Miscarriages / [place] Sister    Arthritis Paternal Grandmother    Diabetes Paternal Grandmother    Breast cancer Neg Hx     SOCIAL HX: reviewed.    Current Outpatient Medications:    blood glucose meter kit and supplies, Dispense based on patient and insurance preference. Use up to four times daily as directed. (FOR ICD-10 E10.9, E11.9)., Disp: 1 each, Rfl: 0   levonorgestrel (MIRENA) 20 MCG/24HR IUD, by Intrauterine route., Disp: , Rfl:    pravastatin (PRAVACHOL) 10 MG tablet, Take 1 tablet (10 mg total) by mouth daily., Disp: 90 tablet, Rfl: 3   tirzepatide (MOUNJARO) 5 MG/0.5ML Pen, Inject 5 mg into the skin once a week., Disp: 2 mL, Rfl: 0  EXAM:  GENERAL: alert, oriented, appears well and in no acute distress  HEENT: atraumatic, conjunttiva clear, no obvious abnormalities on inspection of external nose and ears  NECK: normal movements of the head and neck  LUNGS: on inspection no signs of respiratory distress, breathing rate appears normal, no obvious gross SOB, gasping or wheezing  CV: no obvious cyanosis  PSYCH/NEURO: pleasant and cooperative, no obvious depression or anxiety, speech and thought processing grossly intact  ASSESSMENT AND PLAN:  Discussed the following assessment and plan:  Problem List Items Addressed This Visit     Type 2 diabetes mellitus with hyperglycemia (HCC) - Primary  Increased nausea, diarrhea, etc that she related to mounjaro.  Has worsened with increased dose.  Will stop mounjaro.  Follow symptoms.  Call with update over the next week.  Hold on starting any new medication at this time.  Discussed possibly a trial of ozempic.  Discussed may have same side effects.  Follow.  Call with update. Continue low carb diet and exercise.  Follow met b and A1c.        Hyperlipidemia    Low cholesterol diet and exercise.  Previous trial of crestor.  Has been tolerating  pravastatin. Follow lipid panel and liver function tests.       GERD (gastroesophageal reflux disease)    Feels worsened symptoms related to mounjaro.  Stop medication.  Call with update.        Return if symptoms worsen or fail to improve, for keep scheduled. .   I discussed the assessment and treatment plan with the patient. The patient was provided an opportunity to ask questions and all were answered. The patient agreed with the plan and demonstrated an understanding of the instructions.   The patient was advised to call back or seek an in-person evaluation if the symptoms worsen or if the condition fails to improve as anticipated.    Dale [place], MD

## 2022-08-21 NOTE — Assessment & Plan Note (Signed)
Increased nausea, diarrhea, etc that she related to mounjaro.  Has worsened with increased dose.  Will stop mounjaro.  Follow symptoms.  Call with update over the next week.  Hold on starting any new medication at this time.  Discussed possibly a trial of ozempic.  Discussed may have same side effects.  Follow.  Call with update. Continue low carb diet and exercise.  Follow met b and A1c.

## 2022-08-21 NOTE — Assessment & Plan Note (Signed)
Low cholesterol diet and exercise.  Previous trial of crestor.  Has been tolerating pravastatin. Follow lipid panel and liver function tests.

## 2022-08-21 NOTE — Assessment & Plan Note (Signed)
Feels worsened symptoms related to mounjaro.  Stop medication.  Call with update.

## 2022-09-12 NOTE — Telephone Encounter (Signed)
She originally had an appt scheduled on 09/20/22.  Apparently her appt was moved to September.  See if she can come in 09/21/22 - open spot (around 11:00).  Please change her appt and we can discuss ozempic.

## 2022-09-15 ENCOUNTER — Other Ambulatory Visit: Payer: BC Managed Care – PPO

## 2022-09-15 NOTE — Telephone Encounter (Signed)
Appts have been rescheduled to July

## 2022-09-20 ENCOUNTER — Encounter: Payer: BC Managed Care – PPO | Admitting: Internal Medicine

## 2022-09-20 ENCOUNTER — Other Ambulatory Visit (INDEPENDENT_AMBULATORY_CARE_PROVIDER_SITE_OTHER): Payer: BC Managed Care – PPO

## 2022-09-20 DIAGNOSIS — E1165 Type 2 diabetes mellitus with hyperglycemia: Secondary | ICD-10-CM | POA: Diagnosis not present

## 2022-09-20 DIAGNOSIS — E785 Hyperlipidemia, unspecified: Secondary | ICD-10-CM | POA: Diagnosis not present

## 2022-09-20 LAB — BASIC METABOLIC PANEL
BUN: 14 mg/dL (ref 6–23)
CO2: 26 mEq/L (ref 19–32)
Calcium: 9.1 mg/dL (ref 8.4–10.5)
Chloride: 105 mEq/L (ref 96–112)
Creatinine, Ser: 0.79 mg/dL (ref 0.40–1.20)
GFR: 88.84 mL/min (ref >60.00)
Glucose, Bld: 125 mg/dL — ABNORMAL HIGH (ref 70–99)
Potassium: 4 mEq/L (ref 3.5–5.1)
Sodium: 136 mEq/L (ref 135–145)

## 2022-09-20 LAB — HEPATIC FUNCTION PANEL
ALT: 14 U/L (ref 0–35)
AST: 18 U/L (ref 0–37)
Albumin: 4.1 g/dL (ref 3.5–5.2)
Alkaline Phosphatase: 93 U/L (ref 39–117)
Bilirubin, Direct: 0 mg/dL (ref 0.0–0.3)
Total Bilirubin: 0.4 mg/dL (ref 0.2–1.2)
Total Protein: 7.6 g/dL (ref 6.0–8.3)

## 2022-09-20 LAB — LIPID PANEL
Cholesterol: 233 mg/dL — ABNORMAL HIGH (ref 0–200)
HDL: 54.7 mg/dL (ref >39.00)
LDL Cholesterol: 153 mg/dL — ABNORMAL HIGH (ref 0–99)
NonHDL: 178.75
Total CHOL/HDL Ratio: 4
Triglycerides: 127 mg/dL (ref 0.0–149.0)
VLDL: 25.4 mg/dL (ref 0.0–40.0)

## 2022-09-20 LAB — HEMOGLOBIN A1C: Hgb A1c MFr Bld: 6.3 % (ref 4.6–6.5)

## 2022-09-22 ENCOUNTER — Ambulatory Visit: Payer: BC Managed Care – PPO | Admitting: Internal Medicine

## 2022-09-22 ENCOUNTER — Encounter: Payer: Self-pay | Admitting: Internal Medicine

## 2022-09-22 VITALS — BP 128/74 | HR 76 | Temp 97.9°F | Resp 16 | Ht 65.0 in | Wt 225.0 lb

## 2022-09-22 DIAGNOSIS — Z0001 Encounter for general adult medical examination with abnormal findings: Secondary | ICD-10-CM | POA: Diagnosis not present

## 2022-09-22 DIAGNOSIS — E785 Hyperlipidemia, unspecified: Secondary | ICD-10-CM

## 2022-09-22 DIAGNOSIS — Z1231 Encounter for screening mammogram for malignant neoplasm of breast: Secondary | ICD-10-CM

## 2022-09-22 DIAGNOSIS — E1165 Type 2 diabetes mellitus with hyperglycemia: Secondary | ICD-10-CM | POA: Diagnosis not present

## 2022-09-22 DIAGNOSIS — Z7985 Long-term (current) use of injectable non-insulin antidiabetic drugs: Secondary | ICD-10-CM | POA: Diagnosis not present

## 2022-09-22 DIAGNOSIS — F439 Reaction to severe stress, unspecified: Secondary | ICD-10-CM | POA: Diagnosis not present

## 2022-09-22 DIAGNOSIS — Z0000 Encounter for general adult medical examination without abnormal findings: Secondary | ICD-10-CM

## 2022-09-22 LAB — HM DIABETES FOOT EXAM

## 2022-09-22 MED ORDER — PRAVASTATIN SODIUM 20 MG PO TABS: 20.0000 mg | ORAL_TABLET | Freq: Every day | ORAL | 1 refills | Status: DC

## 2022-09-22 MED ORDER — SEMAGLUTIDE(0.25 OR 0.5MG/DOS) 2 MG/3ML ~~LOC~~ SOPN: 0.2500 mg | PEN_INJECTOR | SUBCUTANEOUS | 2 refills | Status: DC

## 2022-09-22 MED ORDER — VENLAFAXINE HCL ER 37.5 MG PO CP24: 37.5000 mg | ORAL_CAPSULE | Freq: Every day | ORAL | 2 refills | Status: DC

## 2022-09-22 NOTE — Progress Notes (Unsigned)
Subjective:    Patient ID: Colleen Wagner, female    DOB: 31-Aug-1975, 47 y.o.   MRN: [id number]  Patient here for  Chief Complaint  Patient presents with   Annual Exam    HPI Here for physical exam.  History of diabetes and hypercholesterolemia.  Seen 08/19/22 - work in - concern regarding side effects to [employer].  Off mounjaro.  Symptoms resolved. Also intolerance to metformin.  On pravastatin.  Tolerating. Discussed recent labs.  A1c 6.3. watching diet.  GI symptoms resolved.  Wants to try ozempic.  Discussed.  Stays active.  No chest pain.  Breathing stable.  No cough or congestion reported.  No abdominal pain.  Increased stress with her son's health issues.  Discussed.  Increased irritability.  Mood swings.  Hot flashes.  Has mirena IUD.  Has f/u with gyn in 11/2022.  Plans to discuss.  Feels needs something to help level things out.  Discussed treatment options.  Discussed increased cholesterol.  Tolerating daily low dose pravastatin.  Discussed increasing dose.     Past Medical History:  Diagnosis Date   Chicken pox    Diabetes mellitus without complication (HCC)    Past Surgical History:  Procedure Laterality Date   COLONOSCOPY WITH PROPOFOL N/A 06/03/2021   Procedure: COLONOSCOPY WITH PROPOFOL;  Surgeon: Toney Reil, MD;  Location: [facility] ENDOSCOPY;  Service: Gastroenterology;  Laterality: N/A;   GALLBLADDER SURGERY     KNEE ARTHROSCOPY Right 2005   Family History  Problem Relation Age of Onset   Hyperlipidemia Mother    Hypertension Father    Diabetes Father    Stroke Father    Miscarriages / [place] Sister    Arthritis Paternal Grandmother    Diabetes Paternal Grandmother    Breast cancer Neg Hx    Social History   Socioeconomic History   Marital status: Single    Spouse name: Not on file   Number of children: Not on file   Years of education: Not on file   Highest education level: Bachelor's degree (e.g., BA, AB, BS)  Occupational History   Not  on file  Tobacco Use   Smoking status: Former    Current packs/day: 0.00    Average packs/day: 0.5 packs/day for 25.0 years (12.5 ttl pk-yrs)    Types: Cigarettes    Start date: 09/1995    Quit date: 09/2020    Years since quitting: 2.0   Smokeless tobacco: Never   Tobacco comments:    Enjoys smoking  Vaping Use   Vaping status: Every Day  Substance and Sexual Activity   Alcohol use: Not Currently    Comment: rarely   Drug use: Never   Sexual activity: Yes    Birth control/protection: I.U.D.  Other Topics Concern   Not on file  Social History Narrative   No married with 2 sons and works [employer]   Social Determinants of Corporate investment banker Strain: Low Risk  (02/05/2021)   Overall Financial Resource Strain (CARDIA)    Difficulty of Paying Living Expenses: Not hard at all  Food Insecurity: Not on file  Transportation Needs: Not on file  Physical Activity: Not on file  Stress: Not on file  Social Connections: Not on file     Review of Systems  Constitutional:  Negative for appetite change and unexpected weight change.  HENT:  Negative for congestion and sinus pressure.   Respiratory:  Negative for cough, chest tightness and shortness of breath.  Cardiovascular:  Negative for chest pain, palpitations and leg swelling.  Gastrointestinal:  Negative for abdominal pain, diarrhea, nausea and vomiting.  Genitourinary:  Negative for difficulty urinating and dysuria.  Musculoskeletal:  Negative for joint swelling and myalgias.  Skin:  Negative for color change and rash.  Neurological:  Negative for dizziness and headaches.  Psychiatric/Behavioral:  Negative for agitation and dysphoric mood.        Objective:     BP 128/74   Pulse 76   Temp 97.9 F (36.6 C)   Resp 16   Ht 5\' 5"  (1.651 m)   Wt 225 lb (102.1 kg)   SpO2 98%   BMI 37.44 kg/m  Wt Readings from Last 3 Encounters:  09/22/22 225 lb (102.1 kg)  08/19/22 217 lb (98.4 kg)  05/19/22 218 lb  (98.9 kg)    Physical Exam Vitals reviewed.  Constitutional:      General: She is not in acute distress.    Appearance: Normal appearance. She is well-developed.  HENT:     Head: Normocephalic and atraumatic.     Right Ear: External ear normal.     Left Ear: External ear normal.  Eyes:     General: No scleral icterus.       Right eye: No discharge.        Left eye: No discharge.     Conjunctiva/sclera: Conjunctivae normal.  Neck:     Thyroid: No thyromegaly.  Cardiovascular:     Rate and Rhythm: Normal rate and regular rhythm.  Pulmonary:     Effort: No tachypnea, accessory muscle usage or respiratory distress.     Breath sounds: Normal breath sounds. No decreased breath sounds or wheezing.  Chest:  Breasts:    Right: No inverted nipple, mass, nipple discharge or tenderness (no axillary adenopathy).     Left: No inverted nipple, mass, nipple discharge or tenderness (no axilarry adenopathy).  Abdominal:     General: Bowel sounds are normal.     Palpations: Abdomen is soft.     Tenderness: There is no abdominal tenderness.  Musculoskeletal:        General: No swelling or tenderness.     Cervical back: Neck supple. No tenderness.  Lymphadenopathy:     Cervical: No cervical adenopathy.  Skin:    Findings: No erythema or rash.  Neurological:     Mental Status: She is alert and oriented to person, place, and time.  Psychiatric:        Mood and Affect: Mood normal.        Behavior: Behavior normal.      Outpatient Encounter Medications as of 09/22/2022  Medication Sig   pravastatin (PRAVACHOL) 20 MG tablet Take 1 tablet (20 mg total) by mouth daily.   Semaglutide,0.25 or 0.5MG /DOS, 2 MG/3ML SOPN Inject 0.25 mg into the skin once a week.   venlafaxine XR (EFFEXOR XR) 37.5 MG 24 hr capsule Take 1 capsule (37.5 mg total) by mouth daily with breakfast.   blood glucose meter kit and supplies Dispense based on patient and insurance preference. Use up to four times daily as  directed. (FOR ICD-10 E10.9, E11.9).   levonorgestrel (MIRENA) 20 MCG/24HR IUD by Intrauterine route.   [DISCONTINUED] pravastatin (PRAVACHOL) 10 MG tablet Take 1 tablet (10 mg total) by mouth daily.   [DISCONTINUED] tirzepatide [facility]) 5 MG/0.5ML Pen Inject 5 mg into the skin once a week.   No facility-administered encounter medications on file as of 09/22/2022.     Lab Results  Component Value Date   WBC 7.2 01/12/2022   HGB 13.6 01/12/2022   HCT 41.3 01/12/2022   PLT 244.0 01/12/2022   GLUCOSE 125 (H) 09/20/2022   CHOL 233 (H) 09/20/2022   TRIG 127.0 09/20/2022   HDL 54.70 09/20/2022   LDLCALC 153 (H) 09/20/2022   ALT 14 09/20/2022   AST 18 09/20/2022   NA 136 09/20/2022   K 4.0 09/20/2022   CL 105 09/20/2022   CREATININE 0.79 09/20/2022   BUN 14 09/20/2022   CO2 26 09/20/2022   TSH 2.16 01/12/2022   HGBA1C 6.3 09/20/2022   MICROALBUR 0.8 01/12/2022    No results found.     Assessment & Plan:  Encounter for screening mammogram for malignant neoplasm of breast -     3D Screening Mammogram, Left and Right; Future  Type 2 diabetes mellitus with hyperglycemia, without long-term current use of insulin (HCC) Assessment & Plan: Increased nausea, diarrhea, etc that she related to mounjaro.  Worsened with increased dose.  Mounjaro stopped. Symptoms resolved. Discussed possibly a trial of ozempic.  Discussed may have same side effects.  She wants to try ozempic.  Start .25mg  as outlined. Follow.  Call with update. Continue low carb diet and exercise.  Follow met b and A1c.     Stress Assessment & Plan: Increased stress with her son's health issues.  Symptoms and more irritability as outlined.  Feels needs something to help level things out.  Start effexor as directed. Follow.     Hyperlipidemia, unspecified hyperlipidemia type Assessment & Plan: Low cholesterol diet and exercise.  Previous trial of crestor.  Has been tolerating pravastatin. Increase pravastatin to 20mg  q  day. Follow lipid panel and liver function tests.    Healthcare maintenance Assessment & Plan: Physical today 09/22/22.  Mammogram 10/11/21 - Birads I.  Colonoscopy 06/03/21 - normal.  Recommended f/u colonoscopy in 10 years.  Sees gyn for pelvic and pap smears.    Other orders -     Venlafaxine HCl ER; Take 1 capsule (37.5 mg total) by mouth daily with breakfast.  Dispense: 30 capsule; Refill: 2 -     Semaglutide(0.25 or 0.5MG /DOS); Inject 0.25 mg into the skin once a week.  Dispense: 3 mL; Refill: 2 -     Pravastatin Sodium; Take 1 tablet (20 mg total) by mouth daily.  Dispense: 90 tablet; Refill: 1     Dale Benjamin Perez, MD

## 2022-09-25 ENCOUNTER — Encounter: Payer: Self-pay | Admitting: Internal Medicine

## 2022-09-25 NOTE — Assessment & Plan Note (Addendum)
Low cholesterol diet and exercise.  Previous trial of crestor.  Has been tolerating pravastatin. Increase pravastatin to 20mg  q day. Follow lipid panel and liver function tests.

## 2022-09-25 NOTE — Assessment & Plan Note (Signed)
Physical today 09/22/22.  Mammogram 10/11/21 - Birads I.  Colonoscopy 06/03/21 - normal.  Recommended f/u colonoscopy in 10 years.  Sees gyn for pelvic and pap smears.

## 2022-09-25 NOTE — Assessment & Plan Note (Signed)
Increased stress with her son's health issues.  Symptoms and more irritability as outlined.  Feels needs something to help level things out.  Start effexor as directed. Follow.

## 2022-09-25 NOTE — Assessment & Plan Note (Signed)
Increased nausea, diarrhea, etc that she related to mounjaro.  Worsened with increased dose.  Mounjaro stopped. Symptoms resolved. Discussed possibly a trial of ozempic.  Discussed may have same side effects.  She wants to try ozempic.  Start .25mg  as outlined. Follow.  Call with update. Continue low carb diet and exercise.  Follow met b and A1c.

## 2022-10-18 ENCOUNTER — Ambulatory Visit
Admission: RE | Admit: 2022-10-18 | Discharge: 2022-10-18 | Disposition: A | Discharge status: Home / Self Care | Payer: BC Managed Care – PPO | Source: Ambulatory Visit | Attending: Internal Medicine | Admitting: Internal Medicine

## 2022-10-18 DIAGNOSIS — Z1231 Encounter for screening mammogram for malignant neoplasm of breast: Secondary | ICD-10-CM | POA: Diagnosis not present

## 2022-10-19 ENCOUNTER — Encounter: Payer: Self-pay | Admitting: Internal Medicine

## 2022-10-20 ENCOUNTER — Encounter (INDEPENDENT_AMBULATORY_CARE_PROVIDER_SITE_OTHER): Payer: Self-pay

## 2022-10-20 NOTE — Telephone Encounter (Signed)
It appears that she has been on ozempic for four weeks.  If taking and tolerating, I am ok to increase from .25 dose to .5mg  dose.

## 2022-10-21 MED ORDER — SEMAGLUTIDE(0.25 OR 0.5MG/DOS) 2 MG/3ML ~~LOC~~ SOPN: 0.5000 mg | PEN_INJECTOR | SUBCUTANEOUS | 2 refills | Status: DC

## 2022-11-11 ENCOUNTER — Other Ambulatory Visit: Payer: BC Managed Care – PPO

## 2022-11-17 ENCOUNTER — Encounter: Payer: Self-pay | Admitting: Internal Medicine

## 2022-11-17 ENCOUNTER — Telehealth: Payer: BC Managed Care – PPO | Admitting: Internal Medicine

## 2022-11-17 VITALS — Ht 65.0 in | Wt 224.0 lb

## 2022-11-17 DIAGNOSIS — Z7985 Long-term (current) use of injectable non-insulin antidiabetic drugs: Secondary | ICD-10-CM | POA: Diagnosis not present

## 2022-11-17 DIAGNOSIS — F439 Reaction to severe stress, unspecified: Secondary | ICD-10-CM

## 2022-11-17 DIAGNOSIS — E1165 Type 2 diabetes mellitus with hyperglycemia: Secondary | ICD-10-CM

## 2022-11-17 DIAGNOSIS — E785 Hyperlipidemia, unspecified: Secondary | ICD-10-CM | POA: Diagnosis not present

## 2022-11-17 MED ORDER — VENLAFAXINE HCL ER 75 MG PO CP24: 75.0000 mg | ORAL_CAPSULE | Freq: Every day | ORAL | 3 refills | Status: DC

## 2022-11-17 NOTE — Progress Notes (Signed)
Patient ID: Colleen Wagner, female   DOB: [birth year], 47 y.o.   MRN: [id number]   Virtual Visit via video Note  I connected with Colleen Wagner by a video enabled telemedicine application and verified that I am speaking with the correct person using two identifiers. Location patient: home Location provider: work Persons participating in the virtual visit: patient, provider  The limitations, risks, security and privacy concerns of performing an evaluation and management service by video and the availability of in person appointments have bene discussed.  It has also been discussed with the patient that there may be a patient responsible charge related to this service. The patient expressed understanding and agreed to proceed.   Reason for visit: follow up appt.   HPI: Follow up appt.  Last visit had reported increased irritability, hot flashes and mood swings.  Was started on effexor.  Doing well on the medication.  Tolerating.  Does feel has helped.  Discussed adjusting dose.  Agreeable to increase to 75mg  q day.  Also tolerating the low dose of ozempic.  Had problems with mounjaro.  Increasing ozempic dose to .5mg .  Discussed diet and exercise.     ROS: See pertinent positives and negatives per HPI.  Past Medical History:  Diagnosis Date   Chicken pox    Diabetes mellitus without complication (HCC)     Past Surgical History:  Procedure Laterality Date   COLONOSCOPY WITH PROPOFOL N/A 06/03/2021   Procedure: COLONOSCOPY WITH PROPOFOL;  Surgeon: Toney Reil, MD;  Location: [facility] ENDOSCOPY;  Service: Gastroenterology;  Laterality: N/A;   GALLBLADDER SURGERY     KNEE ARTHROSCOPY Right 2005    Family History  Problem Relation Age of Onset   Hyperlipidemia Mother    Hypertension Father    Diabetes Father    Stroke Father    Miscarriages / [place] Sister    Arthritis Paternal Grandmother    Diabetes Paternal Grandmother    Breast cancer Neg Hx     SOCIAL HX: reviewed.     Current Outpatient Medications:    venlafaxine XR (EFFEXOR XR) 75 MG 24 hr capsule, Take 1 capsule (75 mg total) by mouth daily with breakfast., Disp: 30 capsule, Rfl: 3   blood glucose meter kit and supplies, Dispense based on patient and insurance preference. Use up to four times daily as directed. (FOR ICD-10 E10.9, E11.9)., Disp: 1 each, Rfl: 0   levonorgestrel (MIRENA) 20 MCG/24HR IUD, by Intrauterine route., Disp: , Rfl:    pravastatin (PRAVACHOL) 20 MG tablet, Take 1 tablet (20 mg total) by mouth daily., Disp: 90 tablet, Rfl: 1   Semaglutide,0.25 or 0.5MG /DOS, 2 MG/3ML SOPN, Inject 0.5 mg into the skin once a week., Disp: 3 mL, Rfl: 2  EXAM:  GENERAL: alert, oriented, appears well and in no acute distress  HEENT: atraumatic, conjunttiva clear, no obvious abnormalities on inspection of external nose and ears  NECK: normal movements of the head and neck  LUNGS: on inspection no signs of respiratory distress, breathing rate appears normal, no obvious gross SOB, gasping or wheezing  CV: no obvious cyanosis  PSYCH/NEURO: pleasant and cooperative, no obvious depression or anxiety, speech and thought processing grossly intact  ASSESSMENT AND PLAN:  Discussed the following assessment and plan:  Problem List Items Addressed This Visit     Type 2 diabetes mellitus with hyperglycemia (HCC) - Primary    Previous increased nausea, diarrhea, etc - related to mounjaro.  Worsened with increased dose.  Mounjaro stopped. Symptoms resolved.  Now on ozempic and tolerating.  Increase - .5mg  dose. Continue low carb diet and exercise.  Follow met b and A1c.        Stress    Started on effexor last visit.  Tolerating.  Does feel has helped.  Will increase effexor to 75mg  q day. Follow.        Hyperlipidemia    Low cholesterol diet and exercise.  Previous trial of crestor.  Has been tolerating pravastatin. Follow lipid panel and liver function tests.        Return in about 10 weeks  (around 01/26/2023) for follow-up with fasting labs 2 days prior.   I discussed the assessment and treatment plan with the patient. The patient was provided an opportunity to ask questions and all were answered. The patient agreed with the plan and demonstrated an understanding of the instructions.   The patient was advised to call back or seek an in-person evaluation if the symptoms worsen or if the condition fails to improve as anticipated.    Dale [place], MD

## 2022-11-19 ENCOUNTER — Encounter: Payer: Self-pay | Admitting: Internal Medicine

## 2022-11-19 NOTE — Assessment & Plan Note (Signed)
Low cholesterol diet and exercise.  Previous trial of crestor.  Has been tolerating pravastatin. Follow lipid panel and liver function tests.

## 2022-11-19 NOTE — Assessment & Plan Note (Signed)
Started on effexor last visit.  Tolerating.  Does feel has helped.  Will increase effexor to 75mg  q day. Follow.

## 2022-11-19 NOTE — Assessment & Plan Note (Signed)
Previous increased nausea, diarrhea, etc - related to mounjaro.  Worsened with increased dose.  Mounjaro stopped. Symptoms resolved. Now on ozempic and tolerating.  Increase - .5mg  dose. Continue low carb diet and exercise.  Follow met b and A1c.

## 2022-12-06 LAB — HM DIABETES EYE EXAM

## 2022-12-09 ENCOUNTER — Telehealth: Payer: Self-pay | Admitting: Pharmacy Technician

## 2022-12-09 ENCOUNTER — Other Ambulatory Visit (HOSPITAL_COMMUNITY): Payer: Self-pay

## 2022-12-09 NOTE — Telephone Encounter (Signed)
Pharmacy Patient Advocate Encounter   Received notification from CoverMyMeds that prior authorization for Ozempic (0.25 or 0.5 MG/DOSE) 2MG /3ML pen-injectors is required/requested.   Insurance verification completed.   The patient is insured through [employer] .   Per test claim: PA required; PA submitted to [employer] [facility] via CoverMyMeds Key/confirmation #/EOC [id number] Status is pending

## 2022-12-13 NOTE — Telephone Encounter (Signed)
Pharmacy Patient Advocate Encounter  Received notification from [employer] [facility] that Prior Authorization for Ozempic (0.25 or 0.5 MG/DOSE) 2MG /3ML pen-injectors has been APPROVED from 12-09-2022 to 12-08-2025   PA #/Case ID/Reference #: [id number]

## 2022-12-30 ENCOUNTER — Other Ambulatory Visit: Payer: Self-pay | Admitting: Internal Medicine

## 2022-12-30 ENCOUNTER — Encounter: Payer: Self-pay | Admitting: Internal Medicine

## 2023-01-02 NOTE — Telephone Encounter (Signed)
Patient scheduled for virtual with Dr Lorin Picket per her request to discuss referral to ortho for her shoulder./

## 2023-01-03 ENCOUNTER — Telehealth: Payer: BC Managed Care – PPO | Admitting: Internal Medicine

## 2023-01-03 ENCOUNTER — Encounter: Payer: Self-pay | Admitting: Internal Medicine

## 2023-01-03 DIAGNOSIS — E1165 Type 2 diabetes mellitus with hyperglycemia: Secondary | ICD-10-CM

## 2023-01-03 DIAGNOSIS — F439 Reaction to severe stress, unspecified: Secondary | ICD-10-CM | POA: Diagnosis not present

## 2023-01-03 DIAGNOSIS — M25512 Pain in left shoulder: Secondary | ICD-10-CM

## 2023-01-03 NOTE — Progress Notes (Signed)
Patient ID: Colleen Wagner, female   DOB: 07/24/1975, 47 y.o.   MRN: [id number]   Virtual Visit via video Note  I connected with Jecenia Leamer by a video enabled telemedicine application and verified that I am speaking with the correct person using two identifiers. Location patient: home Location provider: work Persons participating in the virtual visit: patient, provider  The limitations, risks, security and privacy concerns of performing an evaluation and management service by video and the availability of in person appointments have been discussed.  It has also been discussed with the patient that there may be a patient responsible charge related to this service. The patient expressed understanding and agreed to proceed. .   Reason for visit: work in appt.   HPI: Work in appt - shoulder pain - left.  Reports that she has noticed increased pain - left shoulder for two months. Pain from shoulder to elbow. Hard to reach posteriorly. Not sleeping well. Describes the burning sensation.  Some limitation with certain movements as outlined. Taking ibuprofen, aleve and tylenol. Also reports she is tolerating semaglutide .5mg  dose.  Discussed increasing the dose.  No increased nausea or vomiting.  No abdominal pain.  Bowels stable.    ROS: See pertinent positives and negatives per HPI.  Past Medical History:  Diagnosis Date   Chicken pox    Diabetes mellitus without complication (HCC)     Past Surgical History:  Procedure Laterality Date   COLONOSCOPY WITH PROPOFOL N/A 06/03/2021   Procedure: COLONOSCOPY WITH PROPOFOL;  Surgeon: Toney Reil, MD;  Location: [facility] ENDOSCOPY;  Service: Gastroenterology;  Laterality: N/A;   GALLBLADDER SURGERY     KNEE ARTHROSCOPY Right 2005    Family History  Problem Relation Age of Onset   Hyperlipidemia Mother    Hypertension Father    Diabetes Father    Stroke Father    Miscarriages / [place] Sister    Arthritis Paternal Grandmother     Diabetes Paternal Grandmother    Breast cancer Neg Hx     SOCIAL HX: reviewed.    Current Outpatient Medications:    Semaglutide, 1 MG/DOSE, 4 MG/3ML SOPN, Inject 1 mg as directed once a week., Disp: 3 mL, Rfl: 2   blood glucose meter kit and supplies, Dispense based on patient and insurance preference. Use up to four times daily as directed. (FOR ICD-10 E10.9, E11.9)., Disp: 1 each, Rfl: 0   levonorgestrel (MIRENA) 20 MCG/24HR IUD, by Intrauterine route., Disp: , Rfl:    pravastatin (PRAVACHOL) 20 MG tablet, Take 1 tablet (20 mg total) by mouth daily., Disp: 90 tablet, Rfl: 3   venlafaxine XR (EFFEXOR XR) 75 MG 24 hr capsule, Take 1 capsule (75 mg total) by mouth daily with breakfast., Disp: 30 capsule, Rfl: 3  EXAM:  GENERAL: alert, oriented, appears well and in no acute distress  HEENT: atraumatic, conjunttiva clear, no obvious abnormalities on inspection of external nose and ears  NECK: normal movements of the head and neck  LUNGS: on inspection no signs of respiratory distress, breathing rate appears normal, no obvious gross SOB, gasping or wheezing  CV: no obvious cyanosis  PSYCH/NEURO: pleasant and cooperative, no obvious depression or anxiety, speech and thought processing grossly intact  ASSESSMENT AND PLAN:  Discussed the following assessment and plan:  Problem List Items Addressed This Visit     Left shoulder pain - Primary    Persistent pain as outlined.  Not responding to tylenol and antiinflammatory medication. Refer to ortho for  further evaluation and treatment.       Relevant Orders   Ambulatory referral to Orthopedic Surgery   Stress    Continue on effexor.       Type 2 diabetes mellitus with hyperglycemia (HCC)    Previous increased nausea, diarrhea, etc - related to mounjaro.  Worsened with increased dose.  Mounjaro stopped. Symptoms resolved. Now on ozempic and tolerating.  Tolerating the.5mg  dose. Discussed increasing to 1mg .  Agreeable. Continue low  carb diet and exercise.  Follow met b and A1c.        Relevant Medications   Semaglutide, 1 MG/DOSE, 4 MG/3ML SOPN    No follow-ups on file.   I discussed the assessment and treatment plan with the patient. The patient was provided an opportunity to ask questions and all were answered. The patient agreed with the plan and demonstrated an understanding of the instructions.   The patient was advised to call back or seek an in-person evaluation if the symptoms worsen or if the condition fails to improve as anticipated.    Dale [place], MD

## 2023-01-08 ENCOUNTER — Encounter: Payer: Self-pay | Admitting: Internal Medicine

## 2023-01-08 DIAGNOSIS — M25512 Pain in left shoulder: Secondary | ICD-10-CM | POA: Insufficient documentation

## 2023-01-08 MED ORDER — SEMAGLUTIDE (1 MG/DOSE) 4 MG/3ML ~~LOC~~ SOPN: 1.0000 mg | PEN_INJECTOR | SUBCUTANEOUS | 2 refills | Status: DC

## 2023-01-08 NOTE — Assessment & Plan Note (Signed)
Persistent pain as outlined.  Not responding to tylenol and antiinflammatory medication. Refer to ortho for further evaluation and treatment.

## 2023-01-08 NOTE — Assessment & Plan Note (Signed)
Continue on effexor.

## 2023-01-08 NOTE — Assessment & Plan Note (Signed)
Previous increased nausea, diarrhea, etc - related to mounjaro.  Worsened with increased dose.  Mounjaro stopped. Symptoms resolved. Now on ozempic and tolerating.  Tolerating the.5mg  dose. Discussed increasing to 1mg .  Agreeable. Continue low carb diet and exercise.  Follow met b and A1c.

## 2023-01-19 ENCOUNTER — Telehealth: Payer: Self-pay | Admitting: Internal Medicine

## 2023-01-19 DIAGNOSIS — E785 Hyperlipidemia, unspecified: Secondary | ICD-10-CM

## 2023-01-19 DIAGNOSIS — E1165 Type 2 diabetes mellitus with hyperglycemia: Secondary | ICD-10-CM

## 2023-01-19 NOTE — Telephone Encounter (Signed)
Patient need lab orders.

## 2023-01-25 ENCOUNTER — Other Ambulatory Visit: Payer: BC Managed Care – PPO

## 2023-01-25 DIAGNOSIS — E1165 Type 2 diabetes mellitus with hyperglycemia: Secondary | ICD-10-CM

## 2023-01-25 DIAGNOSIS — E785 Hyperlipidemia, unspecified: Secondary | ICD-10-CM

## 2023-01-25 LAB — HEPATIC FUNCTION PANEL
ALT: 11 U/L (ref 0–35)
AST: 15 U/L (ref 0–37)
Albumin: 4.1 g/dL (ref 3.5–5.2)
Alkaline Phosphatase: 102 U/L (ref 39–117)
Bilirubin, Direct: 0 mg/dL (ref 0.0–0.3)
Total Bilirubin: 0.6 mg/dL (ref 0.2–1.2)
Total Protein: 7.6 g/dL (ref 6.0–8.3)

## 2023-01-25 LAB — CBC WITH DIFFERENTIAL/PLATELET
Basophils Absolute: 0 10*3/uL (ref 0.0–0.1)
Basophils Relative: 0.3 % (ref 0.0–3.0)
Eosinophils Absolute: 0.3 10*3/uL (ref 0.0–0.7)
Eosinophils Relative: 3.9 % (ref 0.0–5.0)
HCT: 43.4 % (ref 36.0–46.0)
Hemoglobin: 14.5 g/dL (ref 12.0–15.0)
Lymphocytes Relative: 32.9 % (ref 12.0–46.0)
Lymphs Abs: 2.2 10*3/uL (ref 0.7–4.0)
MCHC: 33.5 g/dL (ref 30.0–36.0)
MCV: 92.8 fL (ref 78.0–100.0)
Monocytes Absolute: 0.6 10*3/uL (ref 0.1–1.0)
Monocytes Relative: 9 % (ref 3.0–12.0)
Neutro Abs: 3.7 10*3/uL (ref 1.4–7.7)
Neutrophils Relative %: 53.9 % (ref 43.0–77.0)
Platelets: 246 10*3/uL (ref 150.0–400.0)
RBC: 4.68 Mil/uL (ref 3.87–5.11)
RDW: 13.4 % (ref 11.5–15.5)
WBC: 6.8 10*3/uL (ref 4.0–10.5)

## 2023-01-25 LAB — LIPID PANEL
Cholesterol: 139 mg/dL (ref 0–200)
HDL: 49.9 mg/dL (ref >39.00)
LDL Cholesterol: 70 mg/dL (ref 0–99)
NonHDL: 89.41
Total CHOL/HDL Ratio: 3
Triglycerides: 98 mg/dL (ref 0.0–149.0)
VLDL: 19.6 mg/dL (ref 0.0–40.0)

## 2023-01-25 LAB — BASIC METABOLIC PANEL
BUN: 14 mg/dL (ref 6–23)
CO2: 28 meq/L (ref 19–32)
Calcium: 9.2 mg/dL (ref 8.4–10.5)
Chloride: 102 meq/L (ref 96–112)
Creatinine, Ser: 0.81 mg/dL (ref 0.40–1.20)
GFR: 86.01 mL/min (ref >60.00)
Glucose, Bld: 115 mg/dL — ABNORMAL HIGH (ref 70–99)
Potassium: 4 meq/L (ref 3.5–5.1)
Sodium: 137 meq/L (ref 135–145)

## 2023-01-25 LAB — HEMOGLOBIN A1C: Hgb A1c MFr Bld: 6.2 % (ref 4.6–6.5)

## 2023-01-25 LAB — TSH: TSH: 2.29 u[IU]/mL (ref 0.35–5.50)

## 2023-01-27 ENCOUNTER — Ambulatory Visit (INDEPENDENT_AMBULATORY_CARE_PROVIDER_SITE_OTHER): Payer: BC Managed Care – PPO | Admitting: Internal Medicine

## 2023-01-27 ENCOUNTER — Encounter: Payer: Self-pay | Admitting: Internal Medicine

## 2023-01-27 VITALS — BP 126/70 | HR 88 | Temp 98.0°F | Resp 16 | Ht 65.0 in | Wt 224.6 lb

## 2023-01-27 DIAGNOSIS — E785 Hyperlipidemia, unspecified: Secondary | ICD-10-CM | POA: Diagnosis not present

## 2023-01-27 DIAGNOSIS — Z23 Encounter for immunization: Secondary | ICD-10-CM | POA: Diagnosis not present

## 2023-01-27 DIAGNOSIS — F439 Reaction to severe stress, unspecified: Secondary | ICD-10-CM

## 2023-01-27 DIAGNOSIS — M25512 Pain in left shoulder: Secondary | ICD-10-CM

## 2023-01-27 DIAGNOSIS — Z7985 Long-term (current) use of injectable non-insulin antidiabetic drugs: Secondary | ICD-10-CM

## 2023-01-27 DIAGNOSIS — E1165 Type 2 diabetes mellitus with hyperglycemia: Secondary | ICD-10-CM | POA: Diagnosis not present

## 2023-01-27 MED ORDER — VENLAFAXINE HCL ER 75 MG PO CP24: 75.0000 mg | ORAL_CAPSULE | Freq: Every day | ORAL | 1 refills | Status: DC

## 2023-01-27 NOTE — Progress Notes (Signed)
Subjective:    Patient ID: Colleen Wagner, female    DOB: 09/13/1975, 47 y.o.   MRN: [id number]  Patient here for  Chief Complaint  Patient presents with   Medical Management of Chronic Issues    HPI Here to follow up regarding hypercholesterolemia, diabetes and hot flashes.  Currently on ozempic. Tolerating the 1mg  dose.  Will contact me over the next few weeks with update.  Cannot tell a difference yet with her appetite. She is watching what she eats.  Stays active. Had f/u with gyn 11/30/22.  PAP negative with negative HPV 10/2021. Evaluated by ortho 01/25/23 - left shoulder pain.  S/p steroid injection. Gentle exercise. Planning f/u in 4 weeks.  No abdominal pain or swallowing problems.  No bowel change. Doing well on the effexor.    Past Medical History:  Diagnosis Date   Chicken pox    Diabetes mellitus without complication (HCC)    Past Surgical History:  Procedure Laterality Date   COLONOSCOPY WITH PROPOFOL N/A 06/03/2021   Procedure: COLONOSCOPY WITH PROPOFOL;  Surgeon: Toney Reil, MD;  Location: [facility] ENDOSCOPY;  Service: Gastroenterology;  Laterality: N/A;   GALLBLADDER SURGERY     KNEE ARTHROSCOPY Right 2005   Family History  Problem Relation Age of Onset   Hyperlipidemia Mother    Hypertension Father    Diabetes Father    Stroke Father    Miscarriages / [place] Sister    Arthritis Paternal Grandmother    Diabetes Paternal Grandmother    Breast cancer Neg Hx    Social History   Socioeconomic History   Marital status: Single    Spouse name: Not on file   Number of children: Not on file   Years of education: Not on file   Highest education level: Bachelor's degree (e.g., BA, AB, BS)  Occupational History   Not on file  Tobacco Use   Smoking status: Former    Current packs/day: 0.00    Average packs/day: 0.5 packs/day for 25.0 years (12.5 ttl pk-yrs)    Types: Cigarettes    Start date: 09/1995    Quit date: 09/2020    Years since  quitting: 2.3   Smokeless tobacco: Never  Vaping Use   Vaping status: Every Day  Substance and Sexual Activity   Alcohol use: Not Currently    Comment: rarely   Drug use: Never   Sexual activity: Yes    Birth control/protection: I.U.D.  Other Topics Concern   Not on file  Social History Narrative   No married with 2 sons and works [employer]   Social Determinants of Corporate investment banker Strain: Low Risk  (02/05/2021)   Overall Financial Resource Strain (CARDIA)    Difficulty of Paying Living Expenses: Not hard at all  Food Insecurity: Not on file  Transportation Needs: Not on file  Physical Activity: Not on file  Stress: Not on file  Social Connections: Not on file     Review of Systems  Constitutional:  Negative for appetite change and unexpected weight change.  HENT:  Negative for congestion and sinus pressure.   Respiratory:  Negative for cough, chest tightness and shortness of breath.   Cardiovascular:  Negative for chest pain, palpitations and leg swelling.  Gastrointestinal:  Negative for abdominal pain, diarrhea, nausea and vomiting.  Genitourinary:  Negative for difficulty urinating and dysuria.  Musculoskeletal:  Negative for joint swelling and myalgias.  Skin:  Negative for color change and rash.  Neurological:  Negative for dizziness and headaches.  Psychiatric/Behavioral:  Negative for agitation and dysphoric mood.        Objective:     BP 126/70   Pulse 88   Temp 98 F (36.7 C)   Resp 16   Ht 5\' 5"  (1.651 m)   Wt 224 lb 9.6 oz (101.9 kg)   SpO2 99%   BMI 37.38 kg/m  Wt Readings from Last 3 Encounters:  01/27/23 224 lb 9.6 oz (101.9 kg)  11/17/22 224 lb (101.6 kg)  09/22/22 225 lb (102.1 kg)    Physical Exam Vitals reviewed.  Constitutional:      General: She is not in acute distress.    Appearance: Normal appearance.  HENT:     Head: Normocephalic and atraumatic.     Right Ear: External ear normal.     Left Ear: External  ear normal.     Mouth/Throat:     Pharynx: No oropharyngeal exudate or posterior oropharyngeal erythema.  Eyes:     General: No scleral icterus.       Right eye: No discharge.        Left eye: No discharge.     Conjunctiva/sclera: Conjunctivae normal.  Neck:     Thyroid: No thyromegaly.  Cardiovascular:     Rate and Rhythm: Normal rate and regular rhythm.  Pulmonary:     Effort: No respiratory distress.     Breath sounds: Normal breath sounds. No wheezing.  Abdominal:     General: Bowel sounds are normal.     Palpations: Abdomen is soft.     Tenderness: There is no abdominal tenderness.  Musculoskeletal:        General: No swelling or tenderness.     Cervical back: Neck supple. No tenderness.  Lymphadenopathy:     Cervical: No cervical adenopathy.  Skin:    Findings: No erythema or rash.  Neurological:     Mental Status: She is alert.  Psychiatric:        Mood and Affect: Mood normal.        Behavior: Behavior normal.      Outpatient Encounter Medications as of 01/27/2023  Medication Sig   blood glucose meter kit and supplies Dispense based on patient and insurance preference. Use up to four times daily as directed. (FOR ICD-10 E10.9, E11.9).   levonorgestrel (MIRENA) 20 MCG/24HR IUD by Intrauterine route.   pravastatin (PRAVACHOL) 20 MG tablet Take 1 tablet (20 mg total) by mouth daily.   Semaglutide, 1 MG/DOSE, 4 MG/3ML SOPN Inject 1 mg as directed once a week.   venlafaxine XR (EFFEXOR XR) 75 MG 24 hr capsule Take 1 capsule (75 mg total) by mouth daily with breakfast.   [DISCONTINUED] venlafaxine XR (EFFEXOR XR) 75 MG 24 hr capsule Take 1 capsule (75 mg total) by mouth daily with breakfast.   No facility-administered encounter medications on file as of 01/27/2023.     Lab Results  Component Value Date   WBC 6.8 01/25/2023   HGB 14.5 01/25/2023   HCT 43.4 01/25/2023   PLT 246.0 01/25/2023   GLUCOSE 115 (H) 01/25/2023   CHOL 139 01/25/2023   TRIG 98.0  01/25/2023   HDL 49.90 01/25/2023   LDLCALC 70 01/25/2023   ALT 11 01/25/2023   AST 15 01/25/2023   NA 137 01/25/2023   K 4.0 01/25/2023   CL 102 01/25/2023   CREATININE 0.81 01/25/2023   BUN 14 01/25/2023   CO2 28 01/25/2023   TSH 2.29  01/25/2023   HGBA1C 6.2 01/25/2023   MICROALBUR 0.8 01/12/2022    MM 3D SCREENING MAMMOGRAM BILATERAL BREAST  Result Date: 10/19/2022 CLINICAL DATA:  Screening. EXAM: DIGITAL SCREENING BILATERAL MAMMOGRAM WITH TOMOSYNTHESIS AND CAD TECHNIQUE: Bilateral screening digital craniocaudal and mediolateral oblique mammograms were obtained. Bilateral screening digital breast tomosynthesis was performed. The images were evaluated with computer-aided detection. COMPARISON:  Previous exam(s). ACR Breast Density Category b: There are scattered areas of fibroglandular density. FINDINGS: There are no findings suspicious for malignancy. IMPRESSION: No mammographic evidence of malignancy. A result letter of this screening mammogram will be mailed directly to the patient. RECOMMENDATION: Screening mammogram in one year. (Code:SM-B-01Y) BI-RADS CATEGORY  1: Negative. Electronically Signed   By: Sherian Rein M.D.   On: 10/19/2022 15:59       Assessment & Plan:  Type 2 diabetes mellitus with hyperglycemia, without long-term current use of insulin (HCC) Assessment & Plan: Previous increased nausea, diarrhea, etc - related to mounjaro.  Worsened with increased dose.  Mounjaro stopped. Symptoms resolved. Now on ozempic and tolerating.  Tolerating the 1mg  dose. Continue low carb diet and exercise.  Follow met b and A1c. Call with update.   Orders: -     Hemoglobin A1c; Future -     Microalbumin / creatinine urine ratio; Future  Hyperlipidemia, unspecified hyperlipidemia type Assessment & Plan: Low cholesterol diet and exercise.  Previous trial of crestor.  Has been tolerating pravastatin. Follow lipid panel and liver function tests. Cholesterol improved.   Lab Results   Component Value Date   CHOL 139 01/25/2023   HDL 49.90 01/25/2023   LDLCALC 70 01/25/2023   TRIG 98.0 01/25/2023   CHOLHDL 3 01/25/2023     Orders: -     Lipid panel; Future -     Basic metabolic panel; Future -     Hepatic function panel; Future  Need for influenza vaccination -     Flu vaccine trivalent PF, 6mos and older(Flulaval,Afluria,Fluarix,Fluzone)  Stress Assessment & Plan: Continue on effexor. Stable.    Left shoulder pain, unspecified chronicity Assessment & Plan: Evaluated by ortho 01/25/23 - left shoulder pain.  S/p steroid injection. Gentle exercise. Planning f/u in 4 weeks.    Other orders -     Venlafaxine HCl ER; Take 1 capsule (75 mg total) by mouth daily with breakfast.  Dispense: 90 capsule; Refill: 1     Dale [employer], MD

## 2023-01-27 NOTE — Assessment & Plan Note (Signed)
Evaluated by ortho 01/25/23 - left shoulder pain.  S/p steroid injection. Gentle exercise. Planning f/u in 4 weeks.

## 2023-01-27 NOTE — Assessment & Plan Note (Signed)
Previous increased nausea, diarrhea, etc - related to mounjaro.  Worsened with increased dose.  Mounjaro stopped. Symptoms resolved. Now on ozempic and tolerating.  Tolerating the 1mg  dose. Continue low carb diet and exercise.  Follow met b and A1c. Call with update.

## 2023-01-27 NOTE — Assessment & Plan Note (Addendum)
Low cholesterol diet and exercise.  Previous trial of crestor.  Has been tolerating pravastatin. Follow lipid panel and liver function tests. Cholesterol improved.   Lab Results  Component Value Date   CHOL 139 01/25/2023   HDL 49.90 01/25/2023   LDLCALC 70 01/25/2023   TRIG 98.0 01/25/2023   CHOLHDL 3 01/25/2023

## 2023-01-27 NOTE — Assessment & Plan Note (Signed)
Continue on effexor. Stable.

## 2023-02-10 ENCOUNTER — Encounter: Payer: Self-pay | Admitting: Internal Medicine

## 2023-02-10 NOTE — Telephone Encounter (Signed)
If she is taking and tolerating, ok to send in rx for 2 mg.

## 2023-02-10 NOTE — Telephone Encounter (Signed)
Ok to increase her ozempic to 2 mg?

## 2023-02-13 MED ORDER — OZEMPIC (1 MG/DOSE) 2 MG/1.5ML ~~LOC~~ SOPN: 2.0000 mg | PEN_INJECTOR | SUBCUTANEOUS | 2 refills | Status: DC

## 2023-02-14 MED ORDER — SEMAGLUTIDE (2 MG/DOSE) 8 MG/3ML ~~LOC~~ SOPN: 2.0000 mg | PEN_INJECTOR | SUBCUTANEOUS | 2 refills | Status: DC

## 2023-03-29 ENCOUNTER — Ambulatory Visit: Payer: Self-pay | Admitting: Cardiology

## 2023-05-24 ENCOUNTER — Other Ambulatory Visit (INDEPENDENT_AMBULATORY_CARE_PROVIDER_SITE_OTHER): Payer: BC Managed Care – PPO

## 2023-05-24 DIAGNOSIS — E1165 Type 2 diabetes mellitus with hyperglycemia: Secondary | ICD-10-CM | POA: Diagnosis not present

## 2023-05-24 DIAGNOSIS — E785 Hyperlipidemia, unspecified: Secondary | ICD-10-CM | POA: Diagnosis not present

## 2023-05-24 LAB — LIPID PANEL
Cholesterol: 150 mg/dL (ref 0–200)
HDL: 53.3 mg/dL (ref >39.00)
LDL Cholesterol: 67 mg/dL (ref 0–99)
NonHDL: 96.5
Total CHOL/HDL Ratio: 3
Triglycerides: 148 mg/dL (ref 0.0–149.0)
VLDL: 29.6 mg/dL (ref 0.0–40.0)

## 2023-05-24 LAB — HEPATIC FUNCTION PANEL
ALT: 13 U/L (ref 0–35)
AST: 18 U/L (ref 0–37)
Albumin: 4.2 g/dL (ref 3.5–5.2)
Alkaline Phosphatase: 94 U/L (ref 39–117)
Bilirubin, Direct: 0.1 mg/dL (ref 0.0–0.3)
Total Bilirubin: 0.4 mg/dL (ref 0.2–1.2)
Total Protein: 7.7 g/dL (ref 6.0–8.3)

## 2023-05-24 LAB — BASIC METABOLIC PANEL
BUN: 11 mg/dL (ref 6–23)
CO2: 30 meq/L (ref 19–32)
Calcium: 9.3 mg/dL (ref 8.4–10.5)
Chloride: 103 meq/L (ref 96–112)
Creatinine, Ser: 0.8 mg/dL (ref 0.40–1.20)
GFR: 87.1 mL/min (ref >60.00)
Glucose, Bld: 125 mg/dL — ABNORMAL HIGH (ref 70–99)
Potassium: 3.6 meq/L (ref 3.5–5.1)
Sodium: 139 meq/L (ref 135–145)

## 2023-05-24 LAB — MICROALBUMIN / CREATININE URINE RATIO
Creatinine,U: 164.2 mg/dL
Microalb Creat Ratio: 6.1 mg/g (ref 0.0–30.0)
Microalb, Ur: 1 mg/dL (ref 0.0–1.9)

## 2023-05-24 LAB — HEMOGLOBIN A1C: Hgb A1c MFr Bld: 6.1 % (ref 4.6–6.5)

## 2023-05-29 ENCOUNTER — Encounter: Payer: Self-pay | Admitting: Internal Medicine

## 2023-05-29 ENCOUNTER — Ambulatory Visit: Payer: BC Managed Care – PPO | Admitting: Internal Medicine

## 2023-05-29 VITALS — BP 118/70 | HR 80 | Temp 98.2°F | Resp 16 | Ht 65.0 in | Wt 216.0 lb

## 2023-05-29 DIAGNOSIS — E785 Hyperlipidemia, unspecified: Secondary | ICD-10-CM | POA: Diagnosis not present

## 2023-05-29 DIAGNOSIS — E1165 Type 2 diabetes mellitus with hyperglycemia: Secondary | ICD-10-CM

## 2023-05-29 DIAGNOSIS — F439 Reaction to severe stress, unspecified: Secondary | ICD-10-CM

## 2023-05-29 DIAGNOSIS — Z1211 Encounter for screening for malignant neoplasm of colon: Secondary | ICD-10-CM

## 2023-05-29 DIAGNOSIS — Z7985 Long-term (current) use of injectable non-insulin antidiabetic drugs: Secondary | ICD-10-CM

## 2023-05-29 MED ORDER — VENLAFAXINE HCL ER 75 MG PO CP24: 75.0000 mg | ORAL_CAPSULE | Freq: Every day | ORAL | 1 refills | Status: DC

## 2023-05-29 NOTE — Assessment & Plan Note (Signed)
 Tolerating pravastatin. Cholesterol improved. Continue diet and exercise. Follow lipid panel.  Lab Results  Component Value Date   CHOL 150 05/24/2023   HDL 53.30 05/24/2023   LDLCALC 67 05/24/2023   TRIG 148.0 05/24/2023   CHOLHDL 3 05/24/2023

## 2023-05-29 NOTE — Assessment & Plan Note (Signed)
 Continue on effexor. Stable. Has good support.

## 2023-05-29 NOTE — Progress Notes (Signed)
 Subjective:    Patient ID: Colleen Wagner, female    DOB: 23-Feb-1976, 48 y.o.   MRN: [id number]  Patient here for  Chief Complaint  Patient presents with   Medical Management of Chronic Issues    HPI Here for a scheduled follow up - follow up regarding hypercholesterolemia, diabetes and hot flashes. Currently on ozempic. Tolerating the medication, but feels she is at a stand still with her weight. She is currently on 2mg  ozempic. Discussed change to zepbound. She would like to continue on this medication for now. Discussed diet and exercise. Discussed decreasing diet pepsi intake and transitioning to water instead of diet pepsi. Had f/u with gyn 11/30/22. PAP negative with negative HPV 10/2021. Evaluated by ortho 01/25/23 - left shoulder pain. S/p steroid injection. No chest pain or sob. No abdominal pain or nausea. Bowels moving. Discussed labs.    Past Medical History:  Diagnosis Date   Chicken pox    Diabetes mellitus without complication (HCC)    Past Surgical History:  Procedure Laterality Date   COLONOSCOPY WITH PROPOFOL N/A 06/03/2021   Procedure: COLONOSCOPY WITH PROPOFOL;  Surgeon: Toney Reil, MD;  Location: [facility] ENDOSCOPY;  Service: Gastroenterology;  Laterality: N/A;   GALLBLADDER SURGERY     KNEE ARTHROSCOPY Right 2005   Family History  Problem Relation Age of Onset   Hyperlipidemia Mother    Hypertension Father    Diabetes Father    Stroke Father    Miscarriages / [place] Sister    Arthritis Paternal Grandmother    Diabetes Paternal Grandmother    Breast cancer Neg Hx    Social History   Socioeconomic History   Marital status: Single    Spouse name: Not on file   Number of children: Not on file   Years of education: Not on file   Highest education level: Bachelor's degree (e.g., BA, AB, BS)  Occupational History   Not on file  Tobacco Use   Smoking status: Former    Current packs/day: 0.00    Average packs/day: 0.5 packs/day for 25.0  years (12.5 ttl pk-yrs)    Types: Cigarettes    Start date: 09/1995    Quit date: 09/2020    Years since quitting: 2.7   Smokeless tobacco: Never  Vaping Use   Vaping status: Every Day  Substance and Sexual Activity   Alcohol use: Not Currently    Comment: rarely   Drug use: Never   Sexual activity: Yes    Birth control/protection: I.U.D.  Other Topics Concern   Not on file  Social History Narrative   No married with 2 sons and works [employer]   Social Drivers of Corporate investment banker Strain: Low Risk  (02/05/2021)   Overall Financial Resource Strain (CARDIA)    Difficulty of Paying Living Expenses: Not hard at all  Food Insecurity: Not on file  Transportation Needs: Not on file  Physical Activity: Not on file  Stress: Not on file  Social Connections: Not on file     Review of Systems  Constitutional:  Negative for appetite change and unexpected weight change.  HENT:  Negative for congestion and sinus pressure.   Respiratory:  Negative for cough, chest tightness and shortness of breath.   Cardiovascular:  Negative for chest pain, palpitations and leg swelling.  Gastrointestinal:  Negative for abdominal pain, diarrhea, nausea and vomiting.  Genitourinary:  Negative for difficulty urinating and dysuria.  Musculoskeletal:  Negative for joint swelling and  myalgias.  Skin:  Negative for color change and rash.  Neurological:  Negative for dizziness and headaches.  Psychiatric/Behavioral:  Negative for agitation and dysphoric mood.        Objective:     BP 118/70   Pulse 80   Temp 98.2 F (36.8 C)   Resp 16   Ht 5\' 5"  (1.651 m)   Wt 216 lb (98 kg)   SpO2 98%   BMI 35.94 kg/m  Wt Readings from Last 3 Encounters:  05/29/23 216 lb (98 kg)  01/27/23 224 lb 9.6 oz (101.9 kg)  11/17/22 224 lb (101.6 kg)    Physical Exam Vitals reviewed.  Constitutional:      General: She is not in acute distress.    Appearance: Normal appearance.  HENT:     Head:  Normocephalic and atraumatic.     Right Ear: External ear normal.     Left Ear: External ear normal.     Mouth/Throat:     Pharynx: No oropharyngeal exudate or posterior oropharyngeal erythema.  Eyes:     General: No scleral icterus.       Right eye: No discharge.        Left eye: No discharge.     Conjunctiva/sclera: Conjunctivae normal.  Neck:     Thyroid: No thyromegaly.  Cardiovascular:     Rate and Rhythm: Normal rate and regular rhythm.  Pulmonary:     Effort: No respiratory distress.     Breath sounds: Normal breath sounds. No wheezing.  Abdominal:     General: Bowel sounds are normal.     Palpations: Abdomen is soft.     Tenderness: There is no abdominal tenderness.  Musculoskeletal:        General: No swelling or tenderness.     Cervical back: Neck supple. No tenderness.  Lymphadenopathy:     Cervical: No cervical adenopathy.  Skin:    Findings: No erythema or rash.  Neurological:     Mental Status: She is alert.  Psychiatric:        Mood and Affect: Mood normal.        Behavior: Behavior normal.         Outpatient Encounter Medications as of 05/29/2023  Medication Sig   blood glucose meter kit and supplies Dispense based on patient and insurance preference. Use up to four times daily as directed. (FOR ICD-10 E10.9, E11.9).   levonorgestrel (MIRENA) 20 MCG/24HR IUD by Intrauterine route.   pravastatin (PRAVACHOL) 20 MG tablet Take 1 tablet (20 mg total) by mouth daily.   Semaglutide, 2 MG/DOSE, 8 MG/3ML SOPN Inject 2 mg as directed once a week.   venlafaxine XR (EFFEXOR XR) 75 MG 24 hr capsule Take 1 capsule (75 mg total) by mouth daily with breakfast.   [DISCONTINUED] venlafaxine XR (EFFEXOR XR) 75 MG 24 hr capsule Take 1 capsule (75 mg total) by mouth daily with breakfast.   No facility-administered encounter medications on file as of 05/29/2023.     Lab Results  Component Value Date   WBC 6.8 01/25/2023   HGB 14.5 01/25/2023   HCT 43.4 01/25/2023    PLT 246.0 01/25/2023   GLUCOSE 125 (H) 05/24/2023   CHOL 150 05/24/2023   TRIG 148.0 05/24/2023   HDL 53.30 05/24/2023   LDLCALC 67 05/24/2023   ALT 13 05/24/2023   AST 18 05/24/2023   NA 139 05/24/2023   K 3.6 05/24/2023   CL 103 05/24/2023   CREATININE 0.80 05/24/2023  BUN 11 05/24/2023   CO2 30 05/24/2023   TSH 2.29 01/25/2023   HGBA1C 6.1 05/24/2023   MICROALBUR 1.0 05/24/2023    MM 3D SCREENING MAMMOGRAM BILATERAL BREAST Result Date: 10/19/2022 CLINICAL DATA:  Screening. EXAM: DIGITAL SCREENING BILATERAL MAMMOGRAM WITH TOMOSYNTHESIS AND CAD TECHNIQUE: Bilateral screening digital craniocaudal and mediolateral oblique mammograms were obtained. Bilateral screening digital breast tomosynthesis was performed. The images were evaluated with computer-aided detection. COMPARISON:  Previous exam(s). ACR Breast Density Category b: There are scattered areas of fibroglandular density. FINDINGS: There are no findings suspicious for malignancy. IMPRESSION: No mammographic evidence of malignancy. A result letter of this screening mammogram will be mailed directly to the patient. RECOMMENDATION: Screening mammogram in one year. (Code:SM-B-01Y) BI-RADS CATEGORY  1: Negative. Electronically Signed   By: Sherian Rein M.D.   On: 10/19/2022 15:59       Assessment & Plan:  Stress Assessment & Plan: Continue on effexor. Stable. Has good support.    Hyperlipidemia, unspecified hyperlipidemia type Assessment & Plan: Tolerating pravastatin. Cholesterol improved. Continue diet and exercise. Follow lipid panel.  Lab Results  Component Value Date   CHOL 150 05/24/2023   HDL 53.30 05/24/2023   LDLCALC 67 05/24/2023   TRIG 148.0 05/24/2023   CHOLHDL 3 05/24/2023     Orders: -     Lipid panel; Future -     Basic metabolic panel; Future -     Hepatic function panel; Future  Type 2 diabetes mellitus with hyperglycemia, without long-term current use of insulin (HCC) Assessment & Plan: She is  currently on ozempic 2mg  weekly. Tolerating. Discussed low carb diet and exercise. Discussed decreasing diet pepsi intake and drinking more water. Follow met b and A1c. Recent check improved - 6.1.   Orders: -     Hemoglobin A1c; Future  Colon cancer screening Assessment & Plan: Colonoscopy 05/2021 - normal.  Recommended f/u in 10 years.    Other orders -     Venlafaxine HCl ER; Take 1 capsule (75 mg total) by mouth daily with breakfast.  Dispense: 90 capsule; Refill: 1     Dale [place], MD

## 2023-05-29 NOTE — Assessment & Plan Note (Signed)
Colonoscopy 05/2021 - normal.  Recommended f/u in 10 years.

## 2023-05-29 NOTE — Assessment & Plan Note (Signed)
 She is currently on ozempic 2mg  weekly. Tolerating. Discussed low carb diet and exercise. Discussed decreasing diet pepsi intake and drinking more water. Follow met b and A1c. Recent check improved - 6.1.

## 2023-06-05 ENCOUNTER — Other Ambulatory Visit: Payer: Self-pay

## 2023-06-05 ENCOUNTER — Encounter: Payer: Self-pay | Admitting: Internal Medicine

## 2023-06-05 MED ORDER — SEMAGLUTIDE (2 MG/DOSE) 8 MG/3ML ~~LOC~~ SOPN: 2.0000 mg | PEN_INJECTOR | SUBCUTANEOUS | 2 refills | Status: DC

## 2023-08-03 ENCOUNTER — Encounter: Payer: Self-pay | Admitting: Cardiology

## 2023-09-13 ENCOUNTER — Other Ambulatory Visit: Payer: Self-pay | Admitting: Internal Medicine

## 2023-09-28 ENCOUNTER — Ambulatory Visit: Payer: Self-pay | Admitting: Internal Medicine

## 2023-09-28 ENCOUNTER — Other Ambulatory Visit (INDEPENDENT_AMBULATORY_CARE_PROVIDER_SITE_OTHER)

## 2023-09-28 DIAGNOSIS — E785 Hyperlipidemia, unspecified: Secondary | ICD-10-CM | POA: Diagnosis not present

## 2023-09-28 DIAGNOSIS — E1165 Type 2 diabetes mellitus with hyperglycemia: Secondary | ICD-10-CM

## 2023-09-28 LAB — BASIC METABOLIC PANEL WITH GFR
BUN: 10 mg/dL (ref 6–23)
CO2: 28 meq/L (ref 19–32)
Calcium: 8.9 mg/dL (ref 8.4–10.5)
Chloride: 104 meq/L (ref 96–112)
Creatinine, Ser: 0.78 mg/dL (ref 0.40–1.20)
GFR: 89.57 mL/min (ref >60.00)
Glucose, Bld: 112 mg/dL — ABNORMAL HIGH (ref 70–99)
Potassium: 4.1 meq/L (ref 3.5–5.1)
Sodium: 140 meq/L (ref 135–145)

## 2023-09-28 LAB — LIPID PANEL
Cholesterol: 172 mg/dL (ref 0–200)
HDL: 47.3 mg/dL (ref >39.00)
LDL Cholesterol: 99 mg/dL (ref 0–99)
NonHDL: 125.07
Total CHOL/HDL Ratio: 4
Triglycerides: 131 mg/dL (ref 0.0–149.0)
VLDL: 26.2 mg/dL (ref 0.0–40.0)

## 2023-09-28 LAB — HEPATIC FUNCTION PANEL
ALT: 13 U/L (ref 0–35)
AST: 17 U/L (ref 0–37)
Albumin: 4.1 g/dL (ref 3.5–5.2)
Alkaline Phosphatase: 97 U/L (ref 39–117)
Bilirubin, Direct: 0.1 mg/dL (ref 0.0–0.3)
Total Bilirubin: 0.6 mg/dL (ref 0.2–1.2)
Total Protein: 7.5 g/dL (ref 6.0–8.3)

## 2023-09-28 LAB — HEMOGLOBIN A1C: Hgb A1c MFr Bld: 6.2 % (ref 4.6–6.5)

## 2023-10-02 ENCOUNTER — Ambulatory Visit (INDEPENDENT_AMBULATORY_CARE_PROVIDER_SITE_OTHER): Admitting: Internal Medicine

## 2023-10-02 ENCOUNTER — Encounter: Payer: Self-pay | Admitting: Internal Medicine

## 2023-10-02 VITALS — BP 114/70 | HR 87 | Resp 16 | Ht 65.0 in | Wt 213.0 lb

## 2023-10-02 DIAGNOSIS — F439 Reaction to severe stress, unspecified: Secondary | ICD-10-CM

## 2023-10-02 DIAGNOSIS — E785 Hyperlipidemia, unspecified: Secondary | ICD-10-CM | POA: Diagnosis not present

## 2023-10-02 DIAGNOSIS — Z1231 Encounter for screening mammogram for malignant neoplasm of breast: Secondary | ICD-10-CM

## 2023-10-02 DIAGNOSIS — Z7985 Long-term (current) use of injectable non-insulin antidiabetic drugs: Secondary | ICD-10-CM

## 2023-10-02 DIAGNOSIS — Z0000 Encounter for general adult medical examination without abnormal findings: Secondary | ICD-10-CM

## 2023-10-02 DIAGNOSIS — E1165 Type 2 diabetes mellitus with hyperglycemia: Secondary | ICD-10-CM | POA: Diagnosis not present

## 2023-10-02 LAB — HM DIABETES FOOT EXAM

## 2023-10-02 MED ORDER — VENLAFAXINE HCL ER 75 MG PO CP24: 75.0000 mg | ORAL_CAPSULE | Freq: Every day | ORAL | 1 refills | Status: DC

## 2023-10-02 MED ORDER — PRAVASTATIN SODIUM 20 MG PO TABS: 20.0000 mg | ORAL_TABLET | Freq: Every day | ORAL | 3 refills | Status: AC

## 2023-10-02 MED ORDER — TIRZEPATIDE 2.5 MG/0.5ML ~~LOC~~ SOAJ
2.5000 mg | SUBCUTANEOUS | 2 refills | Status: DC
Start: 2023-10-02 — End: 2024-01-19

## 2023-10-02 NOTE — Assessment & Plan Note (Signed)
 Currently on Ozempic .  Having increased GI issues as outlined.  Feels it is related to the Ozempic .  Will stop Ozempic .  She will call with an update at the end of this week.  Her last injection was Sunday 1 week ago.  Wants to restart Mounjaro .  Will hold until we can get the GI issues sorted through.  Will going send in prescription for prior authorization.  Continue low-carb diet and exercise.  Follow [place] and A1c.

## 2023-10-02 NOTE — Assessment & Plan Note (Signed)
 Continues on effexor . Does not feel needs any further intervention. Follow.

## 2023-10-02 NOTE — Assessment & Plan Note (Addendum)
 Tolerating pravastatin . Cholesterol improved. Continue diet and exercise. Follow lipid panel. No changes today,  Lab Results  Component Value Date   CHOL 172 09/28/2023   HDL 47.30 09/28/2023   LDLCALC 99 09/28/2023   TRIG 131.0 09/28/2023   CHOLHDL 4 09/28/2023

## 2023-10-02 NOTE — Progress Notes (Signed)
 Subjective:    Patient ID: Rexene JINNY Debarah Darol, female    DOB: [birth year], 48 y.o.   MRN: [id number]  Patient here for  Chief Complaint  Patient presents with   Annual Exam    HPI Here for a physical exam. Currently on ozempic  - 2mg  weekly.  Does have an increase GI issues.  She reports increased bloating.  Also increased loose stools.  She is having 3-4 episodes of loose stool per day.  She relates it to the Ozempic .  No other abdominal pain.  No fever.  No vomiting or nausea.  She is eating.  Had f/u with gyn 11/30/22. PAP negative with negative HPV 10/2021. Evaluated by ortho 01/25/23 - left shoulder pain. S/p steroid injection. Continues on effexor .  Discussed stopping the Ozempic .  She would like to restart the Mounjaro .  Would like to get above issues sorted through prior to starting.  Still with increased stress.  Discussed.  Her son is doing some better.   Past Medical History:  Diagnosis Date   Chicken pox    Diabetes mellitus without complication [facility])    Past Surgical History:  Procedure Laterality Date   COLONOSCOPY WITH PROPOFOL  N/A 06/03/2021   Procedure: COLONOSCOPY WITH PROPOFOL ;  Surgeon: Unk Corinn Skiff, MD;  Location[facility] ENDOSCOPY;  Service: Gastroenterology;  Laterality: N/A;   GALLBLADDER SURGERY     KNEE ARTHROSCOPY Right 2005   Family History  Problem Relation Age of Onset   Hyperlipidemia Mother    Hypertension Father    Diabetes Father    Stroke Father    Miscarriages / [place] Sister    Arthritis Paternal Grandmother    Diabetes Paternal Grandmother    Breast cancer Neg Hx    Social History   Socioeconomic History   Marital status: Single    Spouse name: Not on file   Number of children: Not on file   Years of education: Not on file   Highest education level: Bachelor's degree (e.g., BA, AB, BS)  Occupational History   Not on file  Tobacco Use   Smoking status: Former    Current packs/day: 0.00    Average packs/day: 0.5 packs/day  for 25.0 years (12.5 ttl pk-yrs)    Types: Cigarettes    Start date: 09/1995    Quit date: 09/2020    Years since quitting: 3.0   Smokeless tobacco: Never  Vaping Use   Vaping status: Every Day  Substance and Sexual Activity   Alcohol use: Not Currently    Comment: rarely   Drug use: Never   Sexual activity: Yes    Birth control/protection: I.U.D.  Other Topics Concern   Not on file  Social History Narrative   No married with 2 sons and works [employer]   Social Drivers of Corporate investment banker Strain: Low Risk  (02/05/2021)   Overall Financial Resource Strain (CARDIA)    Difficulty of Paying Living Expenses: Not hard at all  Food Insecurity: Not on file  Transportation Needs: Not on file  Physical Activity: Not on file  Stress: Not on file  Social Connections: Not on file     Review of Systems  Constitutional:  Negative for fatigue and unexpected weight change.  HENT:  Negative for congestion, sinus pressure and sore throat.   Eyes:  Negative for pain and visual disturbance.  Respiratory:  Negative for cough, chest tightness and shortness of breath.   Cardiovascular:  Negative for chest pain, palpitations and leg  swelling.  Gastrointestinal:  Negative for abdominal pain, constipation, diarrhea and vomiting.       Increased gas. Bloating. Loose stool   Genitourinary:  Negative for difficulty urinating and dysuria.  Musculoskeletal:  Negative for back pain and joint swelling.  Skin:  Negative for color change and rash.  Neurological:  Negative for dizziness and headaches.  Hematological:  Negative for adenopathy. Does not bruise/bleed easily.  Psychiatric/Behavioral:  Negative for decreased concentration and dysphoric mood.        Objective:     BP 114/70   Pulse 87   Resp 16   Ht 5' 5 (1.651 m)   Wt 213 lb (96.6 kg)   SpO2 98%   BMI 35.45 kg/m  Wt Readings from Last 3 Encounters:  10/02/23 213 lb (96.6 kg)  05/29/23 216 lb (98 kg)  01/27/23  224 lb 9.6 oz (101.9 kg)    Physical Exam Vitals reviewed.  Constitutional:      General: She is not in acute distress.    Appearance: Normal appearance. She is well-developed.  HENT:     Head: Normocephalic and atraumatic.     Right Ear: External ear normal.     Left Ear: External ear normal.     Mouth/Throat:     Pharynx: No oropharyngeal exudate.  Eyes:     General: No scleral icterus.       Right eye: No discharge.        Left eye: No discharge.     Conjunctiva/sclera: Conjunctivae normal.  Neck:     Thyroid : No thyromegaly.  Cardiovascular:     Rate and Rhythm: Normal rate and regular rhythm.  Pulmonary:     Effort: No tachypnea, accessory muscle usage or respiratory distress.     Breath sounds: Normal breath sounds. No decreased breath sounds or wheezing.  Chest:  Breasts:    Right: No inverted nipple, mass, nipple discharge or tenderness (no axillary adenopathy).     Left: No inverted nipple, mass, nipple discharge or tenderness (no axilarry adenopathy).  Abdominal:     General: Bowel sounds are normal.     Palpations: Abdomen is soft.     Tenderness: There is no abdominal tenderness.  Musculoskeletal:        General: Deformity present. No swelling.     Cervical back: Neck supple. Tenderness present.  Lymphadenopathy:     Cervical: No cervical adenopathy.  Skin:    Findings: No erythema or rash.  Neurological:     Mental Status: She is alert and oriented to person, place, and time.  Psychiatric:        Mood and Affect: Mood normal.        Behavior: Behavior normal.         Outpatient Encounter Medications as of 10/02/2023  Medication Sig   tirzepatide  (MOUNJARO ) 2.5 MG/0.5ML Pen Inject 2.5 mg into the skin once a week.   blood glucose meter kit and supplies Dispense based on patient and insurance preference. Use up to four times daily as directed. (FOR ICD-10 E10.9, E11.9).   levonorgestrel (MIRENA) 20 MCG/24HR IUD by Intrauterine route.   pravastatin   (PRAVACHOL ) 20 MG tablet Take 1 tablet (20 mg total) by mouth daily.   venlafaxine  XR (EFFEXOR  XR) 75 MG 24 hr capsule Take 1 capsule (75 mg total) by mouth daily with breakfast.   [DISCONTINUED] pravastatin  (PRAVACHOL ) 20 MG tablet Take 1 tablet (20 mg total) by mouth daily.   [DISCONTINUED] Semaglutide , 2 MG/DOSE, (OZEMPIC , 2 MG/DOSE,)  8 MG/3ML SOPN Inject 2 mg as directed once a week.   [DISCONTINUED] venlafaxine  XR (EFFEXOR  XR) 75 MG 24 hr capsule Take 1 capsule (75 mg total) by mouth daily with breakfast.   No facility-administered encounter medications on file as of 10/02/2023.     Lab Results  Component Value Date   WBC 6.8 01/25/2023   HGB 14.5 01/25/2023   HCT 43.4 01/25/2023   PLT 246.0 01/25/2023   GLUCOSE 112 (H) 09/28/2023   CHOL 172 09/28/2023   TRIG 131.0 09/28/2023   HDL 47.30 09/28/2023   LDLCALC 99 09/28/2023   ALT 13 09/28/2023   AST 17 09/28/2023   NA 140 09/28/2023   K 4.1 09/28/2023   CL 104 09/28/2023   CREATININE 0.78 09/28/2023   BUN 10 09/28/2023   CO2 28 09/28/2023   TSH 2.29 01/25/2023   HGBA1C 6.2 09/28/2023   MICROALBUR 1.0 05/24/2023    MM 3D SCREENING MAMMOGRAM BILATERAL BREAST Result Date: 10/19/2022 CLINICAL DATA:  Screening. EXAM: DIGITAL SCREENING BILATERAL MAMMOGRAM WITH TOMOSYNTHESIS AND CAD TECHNIQUE: Bilateral screening digital craniocaudal and mediolateral oblique mammograms were obtained. Bilateral screening digital breast tomosynthesis was performed. The images were evaluated with computer-aided detection. COMPARISON:  Previous exam(s). ACR Breast Density Category b: There are scattered areas of fibroglandular density. FINDINGS: There are no findings suspicious for malignancy. IMPRESSION: No mammographic evidence of malignancy. A result letter of this screening mammogram will be mailed directly to the patient. RECOMMENDATION: Screening mammogram in one year. (Code:SM-B-01Y) BI-RADS CATEGORY  1: Negative. Electronically Signed   By: Craig Farr M.D.   On: 10/19/2022 15:59       Assessment & Plan:  Routine general medical examination at a health care facility  Hyperlipidemia, unspecified hyperlipidemia type Assessment & Plan: Tolerating pravastatin . Cholesterol improved. Continue diet and exercise. Follow lipid panel. No changes today,  Lab Results  Component Value Date   CHOL 172 09/28/2023   HDL 47.30 09/28/2023   LDLCALC 99 09/28/2023   TRIG 131.0 09/28/2023   CHOLHDL 4 09/28/2023     Orders: -     Lipid panel; Future -     Basic metabolic panel with GFR; Future -     Hepatic function panel; Future -     CBC with Differential/Platelet; Future -     TSH; Future  Type 2 diabetes mellitus with hyperglycemia, without long-term current use of insulin (HCC) Assessment & Plan: Currently on Ozempic .  Having increased GI issues as outlined.  Feels it is related to the Ozempic .  Will stop Ozempic .  She will call with an update at the end of this week.  Her last injection was Sunday 1 week ago.  Wants to restart Mounjaro .  Will hold until we can get the GI issues sorted through.  Will going send in prescription for prior authorization.  Continue low-carb diet and exercise.  Follow [place] and A1c.  Orders: -     Hemoglobin A1c; Future  Healthcare maintenance Assessment & Plan: Physical today 10/02/23.  Mammogram 10/18/22 - Birads I.  Scheduled f/u mammogram. Colonoscopy 06/03/21 - normal.  Recommended f/u colonoscopy in 10 years.  Sees gyn for pelvic and pap smears. Last pap 10/2021 - negative with negative HPV.    Encounter for screening mammogram for malignant neoplasm of breast Assessment & Plan: Schedule for mammogram.   Orders: -     3D Screening Mammogram, Left and Right; Future  Stress Assessment & Plan: Continues on effexor . Does not feel needs any further  intervention. Follow.    Other orders -     Pravastatin  Sodium; Take 1 tablet (20 mg total) by mouth daily.  Dispense: 90 tablet; Refill: 3 -      Venlafaxine  HCl ER; Take 1 capsule (75 mg total) by mouth daily with breakfast.  Dispense: 90 capsule; Refill: 1 -     Tirzepatide ; Inject 2.5 mg into the skin once a week.  Dispense: 2 mL; Refill: 2     Allena Hamilton, MD

## 2023-10-02 NOTE — Assessment & Plan Note (Addendum)
 Physical today 10/02/23.  Mammogram 10/18/22 - Birads I.  Scheduled f/u mammogram. Colonoscopy 06/03/21 - normal.  Recommended f/u colonoscopy in 10 years.  Sees gyn for pelvic and pap smears. Last pap 10/2021 - negative with negative HPV.

## 2023-10-02 NOTE — Assessment & Plan Note (Signed)
-

## 2023-10-09 ENCOUNTER — Encounter: Payer: Self-pay | Admitting: Internal Medicine

## 2023-10-10 ENCOUNTER — Telehealth: Payer: Self-pay

## 2023-10-10 ENCOUNTER — Other Ambulatory Visit (HOSPITAL_COMMUNITY): Payer: Self-pay

## 2023-10-10 NOTE — Telephone Encounter (Signed)
 Mounjaro  has been sent in. Please see if there is anything more that we need to do to get aurthorized. May need to refer to PA team if not already. Also, please call and thank her for the update and I am glad she is feeling better and update her on mounjaro  PA.

## 2023-10-10 NOTE — Telephone Encounter (Signed)
 Pharmacy Patient Advocate Encounter   Received notification from CoverMyMeds that prior authorization for Mounjaro  2.5MG /0.5ML auto-injectors  is required/requested.   Insurance verification completed.   The patient is insured through [facility] .   Per test claim: PA required; PA started via CoverMyMeds. KEY [id number] . Waiting for clinical questions to populate.

## 2023-10-11 NOTE — Telephone Encounter (Signed)
 PLEASE BE ADVISED Clinical questions have been answered and PA submitted.TO PLAN. PA currently Pending.

## 2023-10-11 NOTE — Telephone Encounter (Signed)
 Noted

## 2023-10-12 ENCOUNTER — Other Ambulatory Visit (HOSPITAL_COMMUNITY): Payer: Self-pay

## 2023-10-12 NOTE — Telephone Encounter (Signed)
 Pharmacy Patient Advocate Encounter  Received notification from [facility] that Prior Authorization for Mounjaro  2.5MG /0.5ML auto-injectors has been APPROVED from 10/11/2023 to 10/11/2026   TEST CLAIM TOO SOON PT HAS TO FINISH THE OZEMPIC  OR CAN JUST WAIT UNTIL NEXT FILL DATE OF 10/16/2023  PLEASE BE ADVISED I HAVE CALLED PT AND TOLD HER AS WELL AND SHE UNDERSTOOD AND SAID SHE WOULD WAIT UNTIL 10/16/2023

## 2023-10-12 NOTE — Telephone Encounter (Signed)
 Left message to return call to our office.  Just wanted to let pt know that medication has been approved. Also sent a mychart message.

## 2023-11-02 ENCOUNTER — Ambulatory Visit

## 2023-12-28 ENCOUNTER — Ambulatory Visit

## 2024-01-17 ENCOUNTER — Encounter: Payer: Self-pay | Admitting: Internal Medicine

## 2024-01-18 NOTE — Telephone Encounter (Signed)
 Confirm having no problems on the 2.5mg  dose -  medication and document. I am ok to send in if doing ok.

## 2024-01-19 MED ORDER — TIRZEPATIDE 5 MG/0.5ML ~~LOC~~ SOAJ: 5.0000 mg | SUBCUTANEOUS | 0 refills | Status: DC

## 2024-02-06 ENCOUNTER — Ambulatory Visit: Payer: Self-pay | Admitting: Internal Medicine

## 2024-02-06 ENCOUNTER — Other Ambulatory Visit

## 2024-02-06 DIAGNOSIS — E785 Hyperlipidemia, unspecified: Secondary | ICD-10-CM | POA: Diagnosis not present

## 2024-02-06 DIAGNOSIS — E1165 Type 2 diabetes mellitus with hyperglycemia: Secondary | ICD-10-CM | POA: Diagnosis not present

## 2024-02-06 LAB — BASIC METABOLIC PANEL WITH GFR
BUN: 14 mg/dL (ref 6–23)
CO2: 29 meq/L (ref 19–32)
Calcium: 9 mg/dL (ref 8.4–10.5)
Chloride: 107 meq/L (ref 96–112)
Creatinine, Ser: 0.76 mg/dL (ref 0.40–1.20)
GFR: 92.17 mL/min (ref >60.00)
Glucose, Bld: 103 mg/dL — ABNORMAL HIGH (ref 70–99)
Potassium: 4 meq/L (ref 3.5–5.1)
Sodium: 141 meq/L (ref 135–145)

## 2024-02-06 LAB — CBC WITH DIFFERENTIAL/PLATELET
Basophils Absolute: 0 K/uL (ref 0.0–0.1)
Basophils Relative: 0.3 % (ref 0.0–3.0)
Eosinophils Absolute: 0.1 K/uL (ref 0.0–0.7)
Eosinophils Relative: 1.7 % (ref 0.0–5.0)
HCT: 41.5 % (ref 36.0–46.0)
Hemoglobin: 14 g/dL (ref 12.0–15.0)
Lymphocytes Relative: 36.4 % (ref 12.0–46.0)
Lymphs Abs: 2.5 K/uL (ref 0.7–4.0)
MCHC: 33.6 g/dL (ref 30.0–36.0)
MCV: 91.1 fl (ref 78.0–100.0)
Monocytes Absolute: 0.4 K/uL (ref 0.1–1.0)
Monocytes Relative: 6.3 % (ref 3.0–12.0)
Neutro Abs: 3.8 K/uL (ref 1.4–7.7)
Neutrophils Relative %: 55.3 % (ref 43.0–77.0)
Platelets: 249 K/uL (ref 150.0–400.0)
RBC: 4.56 Mil/uL (ref 3.87–5.11)
RDW: 13.4 % (ref 11.5–15.5)
WBC: 6.8 K/uL (ref 4.0–10.5)

## 2024-02-06 LAB — HEPATIC FUNCTION PANEL
ALT: 12 U/L (ref 0–35)
AST: 15 U/L (ref 0–37)
Albumin: 4.2 g/dL (ref 3.5–5.2)
Alkaline Phosphatase: 88 U/L (ref 39–117)
Bilirubin, Direct: 0.1 mg/dL (ref 0.0–0.3)
Total Bilirubin: 0.4 mg/dL (ref 0.2–1.2)
Total Protein: 7 g/dL (ref 6.0–8.3)

## 2024-02-06 LAB — LIPID PANEL
Cholesterol: 163 mg/dL (ref 0–200)
HDL: 46.1 mg/dL (ref >39.00)
LDL Cholesterol: 90 mg/dL (ref 0–99)
NonHDL: 117.09
Total CHOL/HDL Ratio: 4
Triglycerides: 135 mg/dL (ref 0.0–149.0)
VLDL: 27 mg/dL (ref 0.0–40.0)

## 2024-02-06 LAB — HEMOGLOBIN A1C: Hgb A1c MFr Bld: 6.1 % (ref 4.6–6.5)

## 2024-02-06 LAB — TSH: TSH: 2.48 u[IU]/mL (ref 0.35–5.50)

## 2024-02-09 ENCOUNTER — Ambulatory Visit: Admitting: Internal Medicine

## 2024-02-09 DIAGNOSIS — E785 Hyperlipidemia, unspecified: Secondary | ICD-10-CM

## 2024-02-09 DIAGNOSIS — E1165 Type 2 diabetes mellitus with hyperglycemia: Secondary | ICD-10-CM

## 2024-04-10 ENCOUNTER — Encounter: Payer: Self-pay | Admitting: Internal Medicine

## 2024-04-10 ENCOUNTER — Ambulatory Visit: Admitting: Internal Medicine

## 2024-04-10 VITALS — BP 118/70 | HR 87 | Temp 97.8°F | Ht 65.0 in | Wt 216.8 lb

## 2024-04-10 DIAGNOSIS — Z23 Encounter for immunization: Secondary | ICD-10-CM

## 2024-04-10 DIAGNOSIS — E1165 Type 2 diabetes mellitus with hyperglycemia: Secondary | ICD-10-CM

## 2024-04-10 DIAGNOSIS — G479 Sleep disorder, unspecified: Secondary | ICD-10-CM | POA: Insufficient documentation

## 2024-04-10 DIAGNOSIS — Z87898 Personal history of other specified conditions: Secondary | ICD-10-CM | POA: Insufficient documentation

## 2024-04-10 DIAGNOSIS — F439 Reaction to severe stress, unspecified: Secondary | ICD-10-CM

## 2024-04-10 DIAGNOSIS — E785 Hyperlipidemia, unspecified: Secondary | ICD-10-CM

## 2024-04-10 MED ORDER — VENLAFAXINE HCL ER 75 MG PO CP24: 75.0000 mg | ORAL_CAPSULE | Freq: Every day | ORAL | 1 refills | Status: AC

## 2024-04-10 MED ORDER — TIRZEPATIDE 7.5 MG/0.5ML ~~LOC~~ SOAJ: 7.5000 mg | SUBCUTANEOUS | 1 refills | Status: AC

## 2024-04-10 NOTE — Assessment & Plan Note (Signed)
 Tolerating pravastatin . Cholesterol improved. Continue diet and exercise. Follow lipid panel.  Lab Results  Component Value Date   CHOL 163 02/06/2024   HDL 46.10 02/06/2024   LDLCALC 90 02/06/2024   TRIG 135.0 02/06/2024   CHOLHDL 4 02/06/2024

## 2024-04-10 NOTE — Assessment & Plan Note (Signed)
 Problems falling asleep as outlined. History of snoring. Does not feel rested when wakes up. Discussed evaluation for sleep apnea. Agreeable for referral. Also discussed avoiding screens prior to bed. Discussed avoiding going to be before tired. Follow.

## 2024-04-10 NOTE — Assessment & Plan Note (Signed)
 Overall she feels she is handling things relatively well. Follow. Continue effexor .

## 2024-04-10 NOTE — Assessment & Plan Note (Signed)
 Tolerating mounjaro  5mg  weekly. Does not feel it is suppressing her appetite. Will increase to 7.5mg  q week. Low carb diet. Adequate protein intake. Follow met b and A1c.

## 2024-04-10 NOTE — Progress Notes (Signed)
 "  Subjective:    Patient ID: Colleen Wagner, female    DOB: 03/25/1975, 49 y.o.   MRN: [id number]  Patient here for  Chief Complaint  Patient presents with   Medical Management of Chronic Issues    HPI Here for a scheduled follow up - follow up regarding diabetes, increased stress and hypercholesterolemia.  On mounjaro . Intolerance to ozempic . Tolerating mounjoro. The GI symptoms she experienced with ozempic  have resolved.  Would like to increase mounjaro . Discussed low carb diet and discussed getting adequate protein. Had f/u with gyn 01/16/24 - well woman exam. Last pap 11/04/21. No chest pain or sob reported. No abdominal pain reported. Bowels are moving - normal bm q 3 days. She does report having problems falling asleep. Will go to be around 8:15. Does not fall asleep until 10:00 pm. Has to snooze her alarm a few times before getting out of bed. Does not feel rested. Blood sugar is doing well - 115-128 on average.    Past Medical History:  Diagnosis Date   Chicken pox    Diabetes mellitus without complication [facility])    Past Surgical History:  Procedure Laterality Date   CHOLECYSTECTOMY  05/06/1998   COLONOSCOPY WITH PROPOFOL  N/A 06/03/2021   Procedure: COLONOSCOPY WITH PROPOFOL ;  Surgeon: Unk Corinn Skiff, MD;  Location: [facility] ENDOSCOPY;  Service: Gastroenterology;  Laterality: N/A;   GALLBLADDER SURGERY     KNEE ARTHROSCOPY Right 2005   Family History  Problem Relation Age of Onset   Hyperlipidemia Mother    Hypertension Father    Diabetes Father    Stroke Father    Miscarriages / Stillbirths Sister    Arthritis Paternal Grandmother    Diabetes Paternal Grandmother    ADD / ADHD Son    ADD / ADHD Son    Breast cancer Neg Hx    Social History   Socioeconomic History   Marital status: Single    Spouse name: Not on file   Number of children: Not on file   Years of education: Not on file   Highest education level: Bachelor's degree (e.g., BA, AB, BS)   Occupational History   Not on file  Tobacco Use   Smoking status: Former    Current packs/day: 0.00    Average packs/day: 0.5 packs/day for 25.0 years (12.5 ttl pk-yrs)    Types: Cigarettes    Start date: 09/1995    Quit date: 09/2020    Years since quitting: 3.6   Smokeless tobacco: Never  Vaping Use   Vaping status: Every Day  Substance and Sexual Activity   Alcohol use: Not Currently    Comment: rarely   Drug use: Never   Sexual activity: Yes    Birth control/protection: I.U.D.  Other Topics Concern   Not on file  Social History Narrative   No married with 2 sons and works [employer]   Social Drivers of Health   Tobacco Use: Medium Risk (04/10/2024)   Patient History    Smoking Tobacco Use: Former    Smokeless Tobacco Use: Never    Passive Exposure: Not on file  Financial Resource Strain: Medium Risk (04/09/2024)   Overall Financial Resource Strain (CARDIA)    Difficulty of Paying Living Expenses: Somewhat hard  Food Insecurity: Food Insecurity Present (04/09/2024)   Epic    Worried About Programme Researcher, Broadcasting/film/video in the Last Year: Sometimes true    Ran Out of Food in the Last Year: Never true  Transportation Needs:  No Transportation Needs (04/09/2024)   Epic    Lack of Transportation (Medical): No    Lack of Transportation (Non-Medical): No  Physical Activity: Inactive (04/09/2024)   Exercise Vital Sign    Days of Exercise per Week: 0 days    Minutes of Exercise per Session: Not on file  Stress: No Stress Concern Present (04/09/2024)   [employer] of Occupational Health - Occupational Stress Questionnaire    Feeling of Stress: Not at all  Social Connections: Moderately Isolated (04/09/2024)   Social Connection and Isolation Panel    Frequency of Communication with Friends and Family: More than three times a week    Frequency of Social Gatherings with Friends and Family: Twice a week    Attends Religious Services: 1 to 4 times per year    Active Member of [employer] or  Organizations: No    Attends Engineer, Structural: Not on file    Marital Status: Divorced  Depression (PHQ2-9): Low Risk (04/10/2024)   Depression (PHQ2-9)    PHQ-2 Score: 1  Alcohol Screen: Low Risk (04/09/2024)   Alcohol Screen    Last Alcohol Screening Score (AUDIT): 3  Housing: High Risk (04/09/2024)   Epic    Unable to Pay for Housing in the Last Year: Yes    Number of Times Moved in the Last Year: 0    Homeless in the Last Year: No  Utilities: Not At Risk (01/16/2024)   Received from [facility]   Epic    In the past 12 months has the electric, gas, oil, or water company threatened to shut off services in your home?: No  Health Literacy: Not on file     Review of Systems  Constitutional:  Positive for fatigue. Negative for unexpected weight change.  HENT:  Negative for congestion and sinus pressure.   Respiratory:  Negative for cough, chest tightness and shortness of breath.   Cardiovascular:  Negative for chest pain, palpitations and leg swelling.  Gastrointestinal:  Negative for abdominal pain, diarrhea, nausea and vomiting.  Genitourinary:  Negative for difficulty urinating and dysuria.  Musculoskeletal:  Negative for joint swelling and myalgias.  Skin:  Negative for color change and rash.  Neurological:  Negative for dizziness and headaches.  Psychiatric/Behavioral:  Negative for agitation and dysphoric mood.        Objective:     BP 118/70   Pulse 87   Temp 97.8 F (36.6 C) (Oral)   Ht 5' 5 (1.651 m)   Wt 216 lb 12.8 oz (98.3 kg)   SpO2 99%   BMI 36.08 kg/m  Wt Readings from Last 3 Encounters:  04/10/24 216 lb 12.8 oz (98.3 kg)  10/02/23 213 lb (96.6 kg)  05/29/23 216 lb (98 kg)    Physical Exam Vitals reviewed.  Constitutional:      General: She is not in acute distress.    Appearance: Normal appearance.  HENT:     Head: Normocephalic and atraumatic.     Right Ear: External ear normal.     Left Ear: External ear normal.      Mouth/Throat:     Pharynx: No oropharyngeal exudate or posterior oropharyngeal erythema.  Eyes:     General: No scleral icterus.       Right eye: No discharge.        Left eye: No discharge.     Conjunctiva/sclera: Conjunctivae normal.  Neck:     Thyroid : No thyromegaly.  Cardiovascular:  Rate and Rhythm: Normal rate and regular rhythm.  Pulmonary:     Effort: No respiratory distress.     Breath sounds: Normal breath sounds. No wheezing.  Abdominal:     General: Bowel sounds are normal.     Palpations: Abdomen is soft.     Tenderness: There is no abdominal tenderness.  Musculoskeletal:        General: No swelling or tenderness.     Cervical back: Neck supple. No tenderness.  Lymphadenopathy:     Cervical: No cervical adenopathy.  Skin:    Findings: No erythema or rash.  Neurological:     Mental Status: She is alert.  Psychiatric:        Mood and Affect: Mood normal.        Behavior: Behavior normal.         Outpatient Encounter Medications as of 04/10/2024  Medication Sig   blood glucose meter kit and supplies Dispense based on patient and insurance preference. Use up to four times daily as directed. (FOR ICD-10 E10.9, E11.9).   levonorgestrel (MIRENA) 20 MCG/24HR IUD by Intrauterine route.   pravastatin  (PRAVACHOL ) 20 MG tablet Take 1 tablet (20 mg total) by mouth daily.   tirzepatide  (MOUNJARO ) 7.5 MG/0.5ML Pen Inject 7.5 mg into the skin once a week.   [DISCONTINUED] tirzepatide  (MOUNJARO ) 5 MG/0.5ML Pen Inject 5 mg into the skin once a week.   venlafaxine  XR (EFFEXOR  XR) 75 MG 24 hr capsule Take 1 capsule (75 mg total) by mouth daily with breakfast.   [DISCONTINUED] venlafaxine  XR (EFFEXOR  XR) 75 MG 24 hr capsule Take 1 capsule (75 mg total) by mouth daily with breakfast.   No facility-administered encounter medications on file as of 04/10/2024.     Lab Results  Component Value Date   WBC 6.8 02/06/2024   HGB 14.0 02/06/2024   HCT 41.5 02/06/2024   PLT  249.0 02/06/2024   GLUCOSE 103 (H) 02/06/2024   CHOL 163 02/06/2024   TRIG 135.0 02/06/2024   HDL 46.10 02/06/2024   LDLCALC 90 02/06/2024   ALT 12 02/06/2024   AST 15 02/06/2024   NA 141 02/06/2024   K 4.0 02/06/2024   CL 107 02/06/2024   CREATININE 0.76 02/06/2024   BUN 14 02/06/2024   CO2 29 02/06/2024   TSH 2.48 02/06/2024   HGBA1C 6.1 02/06/2024   MICROALBUR 1.0 05/24/2023    MM 3D SCREENING MAMMOGRAM BILATERAL BREAST Result Date: 10/19/2022 CLINICAL DATA:  Screening. EXAM: DIGITAL SCREENING BILATERAL MAMMOGRAM WITH TOMOSYNTHESIS AND CAD TECHNIQUE: Bilateral screening digital craniocaudal and mediolateral oblique mammograms were obtained. Bilateral screening digital breast tomosynthesis was performed. The images were evaluated with computer-aided detection. COMPARISON:  Previous exam(s). ACR Breast Density Category b: There are scattered areas of fibroglandular density. FINDINGS: There are no findings suspicious for malignancy. IMPRESSION: No mammographic evidence of malignancy. A result letter of this screening mammogram will be mailed directly to the patient. RECOMMENDATION: Screening mammogram in one year. (Code:[id number]-B-01Y) BI-RADS CATEGORY  1: Negative. Electronically Signed   By: Craig Farr M.D.   On: 10/19/2022 15:59       Assessment & Plan:  Type 2 diabetes mellitus with hyperglycemia, without long-term current use of insulin (HCC) Assessment & Plan: Tolerating mounjaro  5mg  weekly. Does not feel it is suppressing her appetite. Will increase to 7.5mg  q week. Low carb diet. Adequate protein intake. Follow met b and A1c.   Orders: -     Hemoglobin A1c; Future  Hyperlipidemia, unspecified hyperlipidemia type Assessment &  Plan: Tolerating pravastatin . Cholesterol improved. Continue diet and exercise. Follow lipid panel.  Lab Results  Component Value Date   CHOL 163 02/06/2024   HDL 46.10 02/06/2024   LDLCALC 90 02/06/2024   TRIG 135.0 02/06/2024   CHOLHDL 4 02/06/2024      Orders: -     Lipid panel; Future -     Basic metabolic panel with GFR; Future -     Hepatic function panel; Future  Need for influenza vaccination -     Flu vaccine trivalent PF, 6mos and older(Flulaval,Afluria,Fluarix,Fluzone)  History of snoring Assessment & Plan: Sleep issues as outlined. Plan for referral to pulmonary for evaluation of possible sleep apnea.   Orders: -     Pulmonary Visit  Sleep difficulties Assessment & Plan: Problems falling asleep as outlined. History of snoring. Does not feel rested when wakes up. Discussed evaluation for sleep apnea. Agreeable for referral. Also discussed avoiding screens prior to bed. Discussed avoiding going to be before tired. Follow.   Orders: -     Pulmonary Visit  Stress Assessment & Plan: Overall she feels she is handling things relatively well. Follow. Continue effexor .    Other orders -     Venlafaxine  HCl ER; Take 1 capsule (75 mg total) by mouth daily with breakfast.  Dispense: 90 capsule; Refill: 1 -     Tirzepatide ; Inject 7.5 mg into the skin once a week.  Dispense: 6 mL; Refill: 1     Allena Hamilton, MD "

## 2024-04-10 NOTE — Assessment & Plan Note (Signed)
 Sleep issues as outlined. Plan for referral to pulmonary for evaluation of possible sleep apnea.

## 2024-04-12 ENCOUNTER — Ambulatory Visit: Admission: RE | Admit: 2024-04-12

## 2024-04-12 DIAGNOSIS — Z1231 Encounter for screening mammogram for malignant neoplasm of breast: Secondary | ICD-10-CM

## 2024-06-10 ENCOUNTER — Other Ambulatory Visit

## 2024-06-13 ENCOUNTER — Ambulatory Visit: Admitting: Internal Medicine
